# Patient Record
Sex: Female | Born: 1975
Health system: Southern US, Community
[De-identification: ages and names within clinical notes are randomized; demographics above are authoritative.]

## PROBLEM LIST (undated history)

## (undated) ENCOUNTER — Emergency Department (HOSPITAL_COMMUNITY): Admission: EM | Payer: Self-pay | Source: Home / Self Care

## (undated) DIAGNOSIS — Z800 Family history of malignant neoplasm of digestive organs: Secondary | ICD-10-CM

## (undated) DIAGNOSIS — K219 Gastro-esophageal reflux disease without esophagitis: Secondary | ICD-10-CM

## (undated) DIAGNOSIS — D649 Anemia, unspecified: Secondary | ICD-10-CM

## (undated) HISTORY — DX: Family history of malignant neoplasm of digestive organs: Z80.0

## (undated) HISTORY — DX: Gastro-esophageal reflux disease without esophagitis: K21.9

## (undated) HISTORY — DX: Anemia, unspecified: D64.9

---

## 1998-01-25 ENCOUNTER — Ambulatory Visit (HOSPITAL_COMMUNITY): Admission: RE | Admit: 1998-01-25 | Discharge: 1998-01-25 | Payer: Self-pay | Admitting: Gastroenterology

## 1999-04-27 ENCOUNTER — Inpatient Hospital Stay (HOSPITAL_COMMUNITY): Admission: AD | Admit: 1999-04-27 | Discharge: 1999-04-27 | Payer: Self-pay | Admitting: Obstetrics and Gynecology

## 1999-07-29 ENCOUNTER — Inpatient Hospital Stay (HOSPITAL_COMMUNITY): Admission: AD | Admit: 1999-07-29 | Discharge: 1999-07-29 | Payer: Self-pay | Admitting: Obstetrics and Gynecology

## 1999-07-30 ENCOUNTER — Inpatient Hospital Stay (HOSPITAL_COMMUNITY): Admission: AD | Admit: 1999-07-30 | Discharge: 1999-07-30 | Payer: Self-pay | Admitting: Obstetrics & Gynecology

## 1999-08-06 ENCOUNTER — Inpatient Hospital Stay (HOSPITAL_COMMUNITY): Admission: AD | Admit: 1999-08-06 | Discharge: 1999-08-08 | Payer: Self-pay | Admitting: Obstetrics and Gynecology

## 2001-01-31 ENCOUNTER — Ambulatory Visit (HOSPITAL_COMMUNITY): Admission: RE | Admit: 2001-01-31 | Discharge: 2001-01-31 | Payer: Self-pay | Admitting: Gastroenterology

## 2001-01-31 ENCOUNTER — Encounter (INDEPENDENT_AMBULATORY_CARE_PROVIDER_SITE_OTHER): Payer: Self-pay | Admitting: *Deleted

## 2001-08-12 ENCOUNTER — Other Ambulatory Visit: Admission: RE | Admit: 2001-08-12 | Discharge: 2001-08-12 | Payer: Self-pay | Admitting: Obstetrics and Gynecology

## 2002-01-31 ENCOUNTER — Inpatient Hospital Stay (HOSPITAL_COMMUNITY): Admission: AD | Admit: 2002-01-31 | Discharge: 2002-01-31 | Payer: Self-pay | Admitting: Obstetrics and Gynecology

## 2002-03-03 ENCOUNTER — Inpatient Hospital Stay (HOSPITAL_COMMUNITY): Admission: AD | Admit: 2002-03-03 | Discharge: 2002-03-05 | Payer: Self-pay | Admitting: Obstetrics and Gynecology

## 2002-09-01 ENCOUNTER — Other Ambulatory Visit: Admission: RE | Admit: 2002-09-01 | Discharge: 2002-09-01 | Payer: Self-pay | Admitting: Obstetrics and Gynecology

## 2004-02-15 ENCOUNTER — Other Ambulatory Visit: Admission: RE | Admit: 2004-02-15 | Discharge: 2004-02-15 | Payer: Self-pay | Admitting: Obstetrics and Gynecology

## 2004-02-29 ENCOUNTER — Inpatient Hospital Stay (HOSPITAL_COMMUNITY): Admission: AD | Admit: 2004-02-29 | Discharge: 2004-02-29 | Payer: Self-pay | Admitting: Obstetrics and Gynecology

## 2004-09-11 ENCOUNTER — Inpatient Hospital Stay (HOSPITAL_COMMUNITY): Admission: AD | Admit: 2004-09-11 | Discharge: 2004-09-13 | Payer: Self-pay | Admitting: Obstetrics and Gynecology

## 2005-02-17 ENCOUNTER — Other Ambulatory Visit: Admission: RE | Admit: 2005-02-17 | Discharge: 2005-02-17 | Payer: Self-pay | Admitting: Obstetrics and Gynecology

## 2005-09-27 ENCOUNTER — Encounter: Admission: RE | Admit: 2005-09-27 | Discharge: 2005-09-27 | Payer: Self-pay | Admitting: Family Medicine

## 2007-05-09 ENCOUNTER — Encounter: Admission: RE | Admit: 2007-05-09 | Discharge: 2007-05-09 | Payer: Self-pay | Admitting: Family Medicine

## 2008-02-14 ENCOUNTER — Encounter: Admission: RE | Admit: 2008-02-14 | Discharge: 2008-02-14 | Payer: Self-pay

## 2008-02-25 ENCOUNTER — Encounter: Admission: RE | Admit: 2008-02-25 | Discharge: 2008-02-25 | Payer: Self-pay | Admitting: Family Medicine

## 2009-02-25 ENCOUNTER — Encounter: Admission: RE | Admit: 2009-02-25 | Discharge: 2009-02-25 | Payer: Self-pay | Admitting: Endocrinology

## 2010-01-21 IMAGING — US US SOFT TISSUE HEAD/NECK
1 series · 14 of 25 positions shown · non-contrast
Comparison: [place] thyroid ultrasound 03/02/2008.

CLINICAL DATA: Follow-up goiter.  Family history thyroid disease.

THYROID ULTRASOUND
TECHNIQUE: Ultrasound examination of the thyroid gland and
adjacent soft tissues was performed.

[Series 1: us thyroid · 0.07mm/px · 14 of 28 slices shown]
[im 1/28]
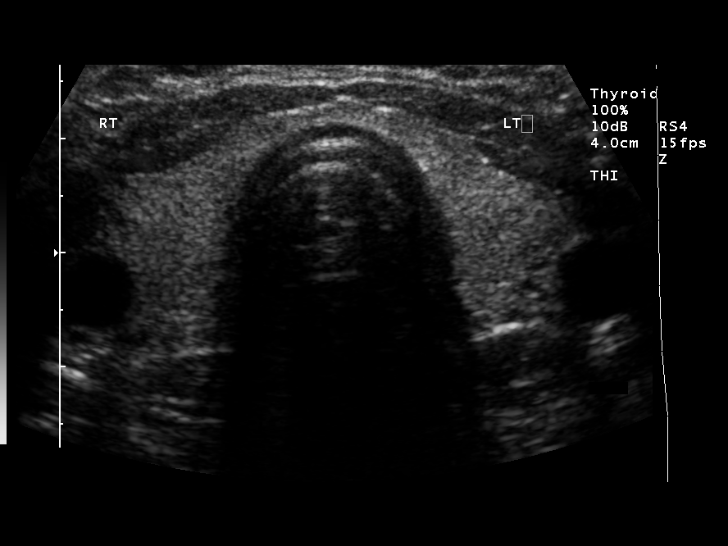
[im 3/28]
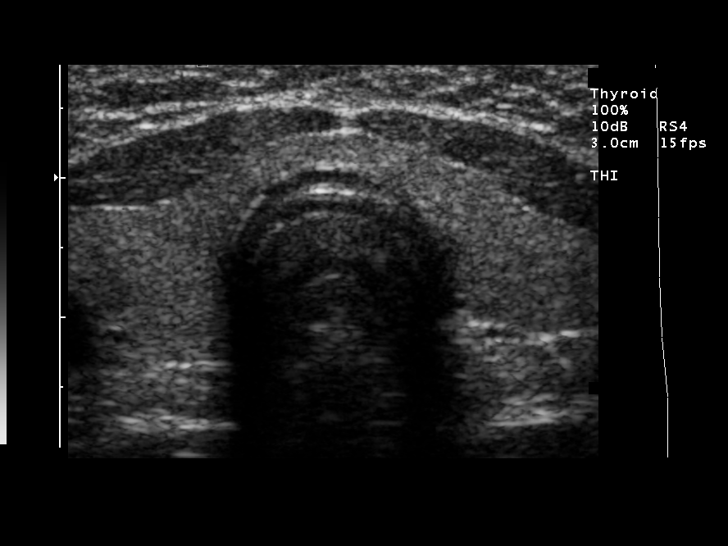
[im 5/28]
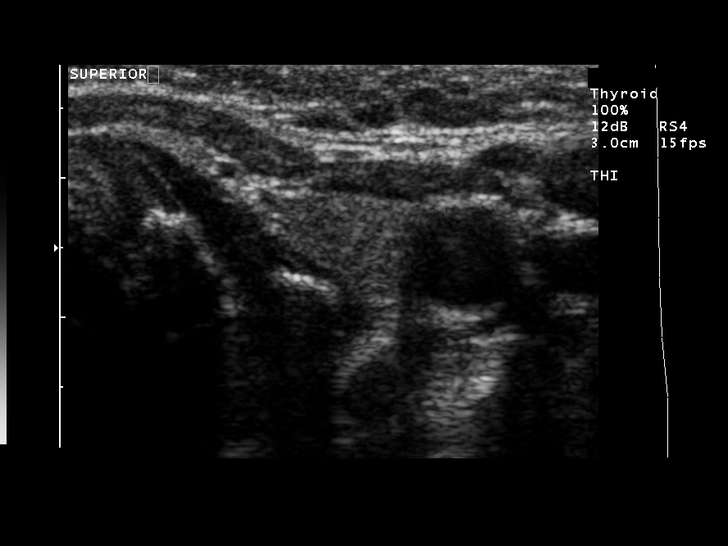
[im 7/28]
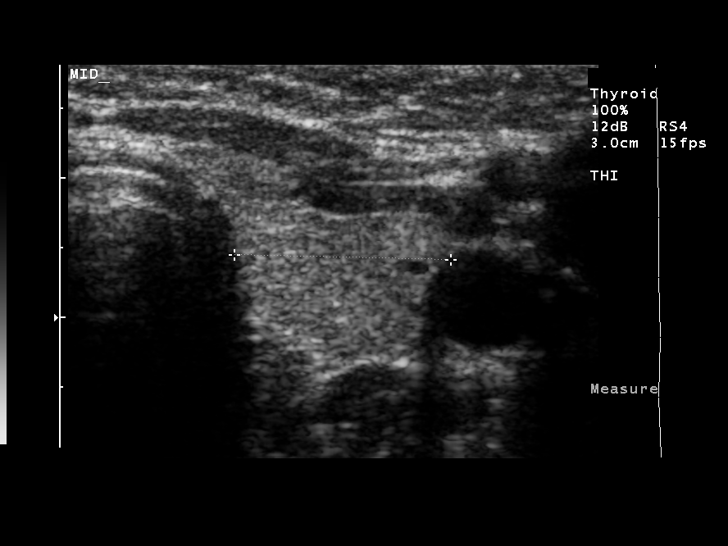
[im 10/28]
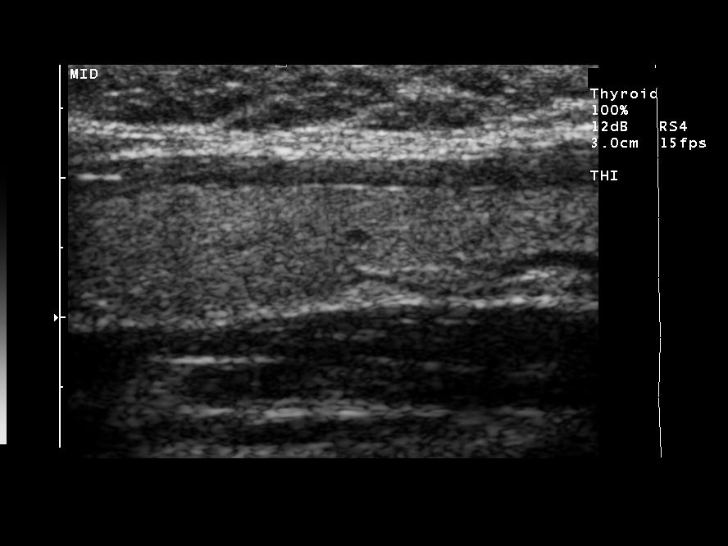
[im 11/28]
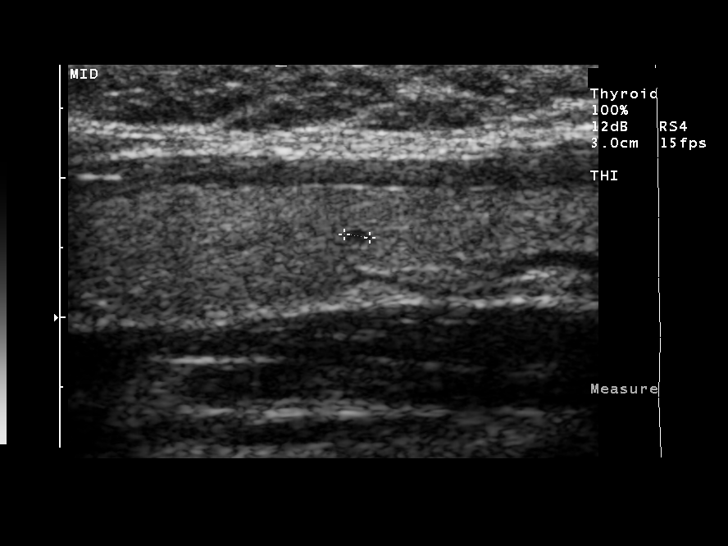
[im 13/28]
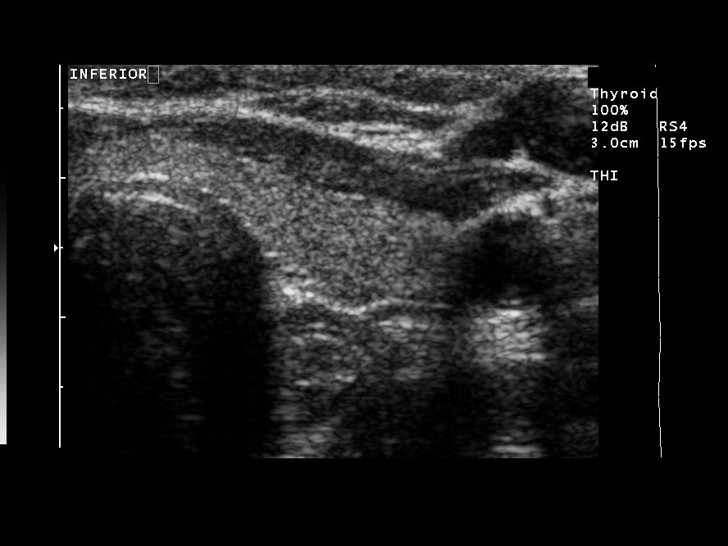
[im 15/28]
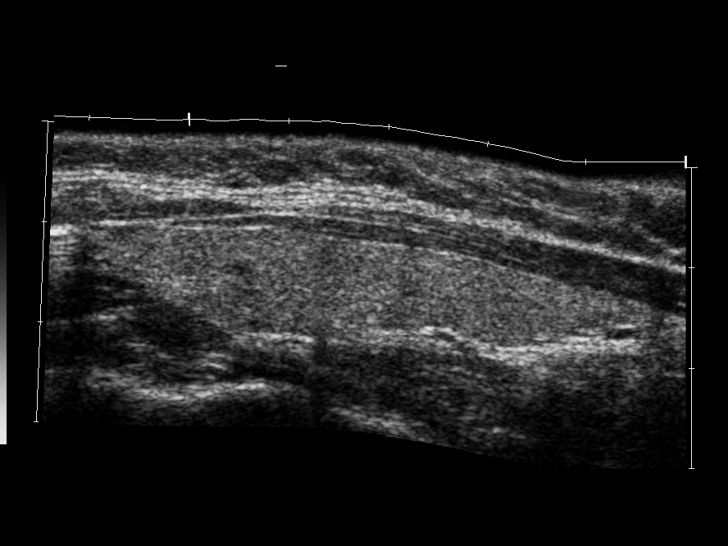
[im 17/28]
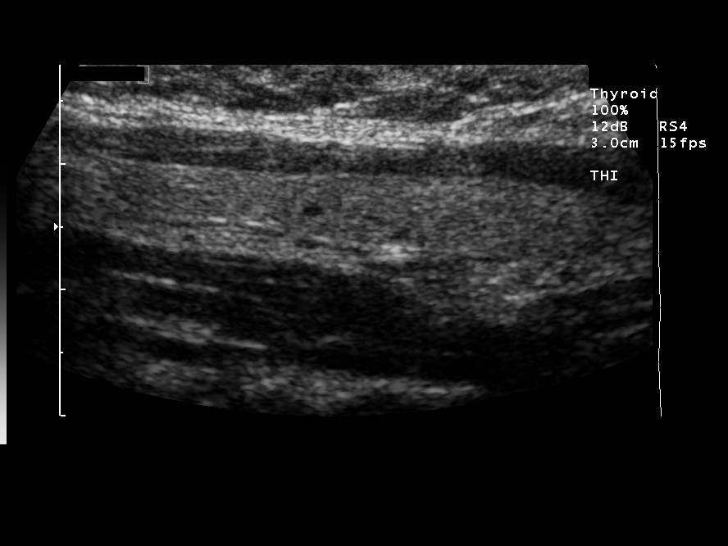
[im 19/28]
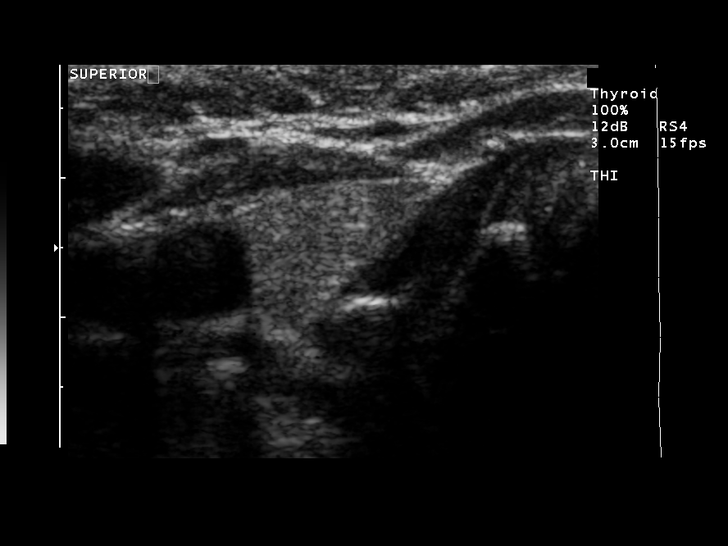
[im 21/28]
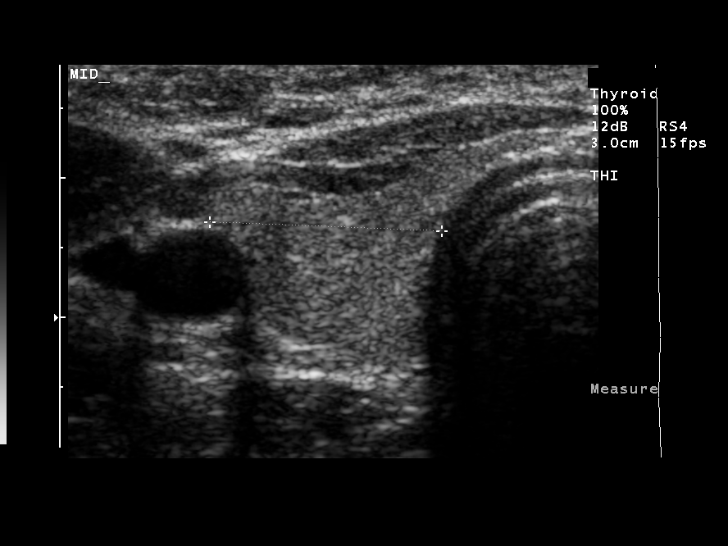
[im 23/28]
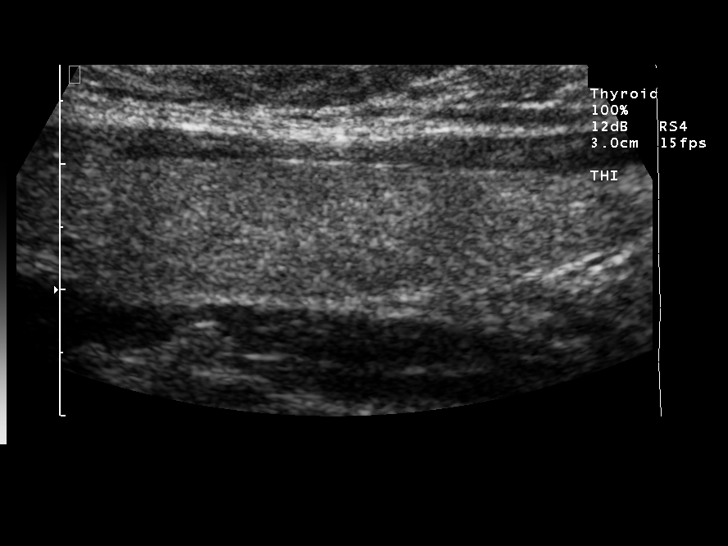
[im 25/28]
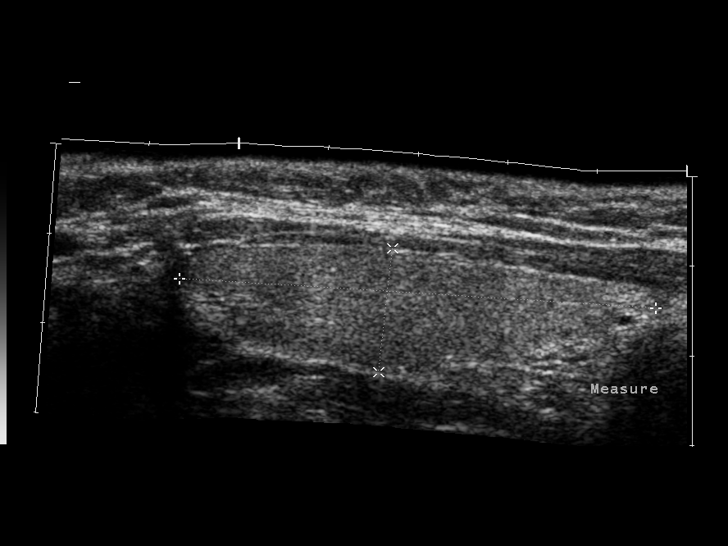
[im 28/28]
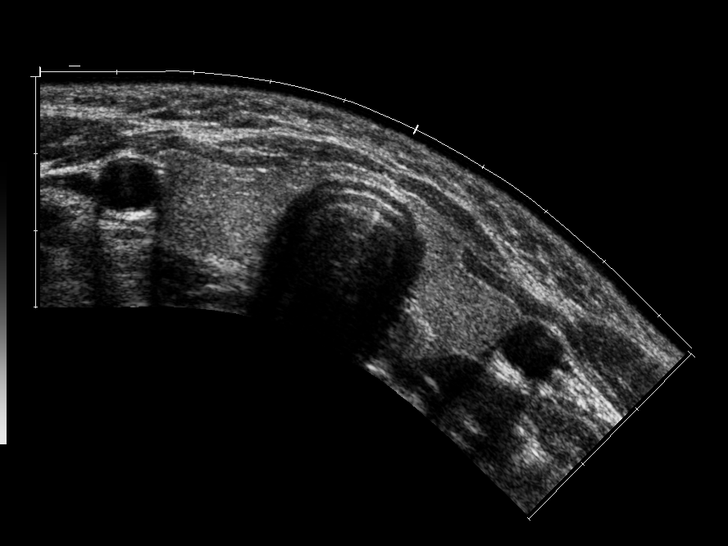

[14 of 25 positions shown; findings below may reference images not displayed]

FINDINGS: Stable thyroid size upper limits of normal with right
lobe measures 5.4 cm long X 1.5 cm AP X 1.8 cm wide (03/02/2008:
5.3 X 1.4 X 1.7 cm).  The left lobe currently measures 4.9 cm long
X 1.2 cm AP X 1.6 cm wide (03/02/2008: 5.6 X 1.1 X 1.6 cm).
Isthmus measures normally at 2 mm AP thickness.  Thyroid
echotexture remains homogeneous.  At the mid left lobe thyroid is
simple 3 mm cyst with increased through transmission.  No focal
solid lesions seen.
IMPRESSION: 1.  3 mm mid left thyroid benign appearing cyst.
2.  Otherwise, negative.

## 2010-06-24 NOTE — H&P (Signed)
Sarah Shaw, [place] NO.:  [id number]   MEDICAL RECORD NO.:  [id number]          PATIENT TYPE:  MAT   LOCATION:  MAT[facility]                          FACILITY:  [facility]   PHYSICIAN:  Hal Morales, M.D.DATE OF BIRTH:  29-Apr-1975   DATE OF ADMISSION:  09/11/2004  DATE OF DISCHARGE:                                HISTORY & PHYSICAL   Sarah Shaw is a 35 year old married black female, gravida 3, para 2-0-0-2,  at 40 weeks who presents with contractions this evening, every five minutes.  She denies leaking and bleeding.  No signs or symptoms of PIH.  Her  pregnancy has been followed by [facility] certified nurse  midwife service and has been remarkable for hiatal hernia and group B strep  negative.   PRENATAL LABS:  Her prenatal labs were collected on February 15, 2004.  Hemoglobin 13.1, hematocrit 38.5, platelets 285,000, blood type B positive,  antibody negative.  Sickle cell trait negative.  RPR nonreactive.  Rubella  immune.  Hepatitis B surface antigen negative.  Quad screen from April 13, 2004 is within normal limits.  One-hour Glucola from Jun 09, 2004 is 99.  Culture of the vaginal tract for group B strep on August 15, 2004 was  negative.   HISTORY OF PRESENT PREGNANCY:  The patient presented for care at [facility] on February 15, 2004 at [redacted] weeks gestation.  She had IV hydration at  [facility] at [redacted] weeks gestation. She had strep throat at 14 weeks.  Ultrasonography at [redacted] weeks gestation shows growth consistent with previous  dating confirming [facility] of September 11, 2004.  The rest of her prenatal care has  been unremarkable.   OB HISTORY:  She is a gravida 3, para 2-0-0-2.  Initial June 2001 she had a  vaginal delivery of a female infant weighing 6 pounds 5 ounces at 39-6/[redacted] weeks  gestation after 15 hours in labor.  Infant's name was Sarah Shaw.  In January  2004 she had a vaginal delivery of a female infant weighing 7 pounds and 6  ounces at 39-2/[redacted] weeks  gestation after 14 hours in the labor.  Infant's name  was Sarah Shaw.  The pregnancies were uncomplicated.   PAST MEDICAL HISTORY:  1.  No medication allergies.  2.  She experienced menarche at age of 80 with 28-30 days cycles lasting      three to four days.  3.  She has used condoms and foam in the past for contraception.  4.  She reports having had the usual childhood illnesses.  5.  She has history of hiatal hernia.  6.  She had an endoscopy performed in 1992.   FAMILY HISTORY:  Remarkable for mother with hypertension.  Genetic history  is negative.   SOCIAL HISTORY:  The patient is married to the father of the baby.  His name  is Tajma.  He is involved in support as they are the [employer].  The  patient is employed as an Scientist, forensic at [employer].  The patient father of the  baby is a dialysis  tech.  They deny any alcohol, tobacco or illicit drug use  with this pregnancy.   PHYSICAL EXAMINATION:  VITAL SIGNS:  Stable.  Afebrile.  HEENT:  Within normal limits.  CHEST:  Clear to auscultation.  HEART:  Regular rate and rhythm.  ABDOMEN:  Gravid contour with fundal height extending approximately 40 cm  above the pubic symphysis.  Fetal heart rate is remarkable for frequent  variables and early variables with decreased long-term variability.  Contractions every 6 minutes.  PELVIC:  Cervix is 2 cm, 60%, posterior, vertex -2 with intact membranes and  remain unchanged after 45 minutes.  EXTREMITIES:  Normal.   ASSESSMENT:  1.  Intrauterine pregnancy at term.  2.  Nonreactive fetal heart rate.   PLAN:  Admit the patient to nursing suite.  Options were reviewed with the  patient of:  1.  Observe fetal heart rate during the night, versus  2.  Induce labor tonight due to fetal heart rate tracing.   The patient elects induction of labor tonight.  The plan is to artificially  rupture her membranes and Dr. Pennie Rushing is aware.       KS/MEDQ  D:  09/11/2004  T:  09/11/2004  Job:   [id number]

## 2010-06-24 NOTE — H&P (Signed)
[facility]  Patient:    Sarah Shaw, Sarah Shaw                      MRN: [id number] Adm. Date:  [id number] Attending:  Leonard Schwartz Dictator:   Miguel Dibble, C.N.M.                         History and Physical  DATE OF BIRTH:                07-Jul-1975  HISTORY OF PRESENT ILLNESS:   This is a 35 year old gravida 1, para 0 at 39-6/7 weeks who was evaluated on the maternity admissions unit and found to progress from 4 cm, 90% effaced to 5 cm, 90% effaced and -1 after one hour of evaluation.  She has a positive group beta strep history.  PRENATAL LABORATORY DATA:      Hemoglobin 12.6, hematocrit unavailable and platelets 256,000.  Blood type and Rh:  B positive, Rh antibodies negative. VDRL nonreactive.  Rubella titer positive.  Hepatitis B surface antigen negative.  HIV negative.  Pap smear within normal limits.  PPD negative in February of 2001.  Maternal serum alpha-fetoprotein within normal limits. Glucola challenge test at 28 was within normal limits.  Group beta strep at 36 weeks was positive.  Ultrasound reveals normal anatomy, confirms [facility] of August 07, 1999.  Patient transferred care from [facility] at 21 weeks with records.  MEDICAL HISTORY:              Denies drug allergies.  History of hiatal hernia, gastroesophageal reflux disease, ______.  Used Prilosec and Propulsid.  Patient had an endoscopy in 1992 for GI problems.  FAMILY HISTORY:               Mother with hypertension.  GENETIC HISTORY:              Negative.  SOCIAL HISTORY:               Married to [place].  African-American. Baptist religion.  College graduate.  Works as an Astronomer. full-time.  Father of the baby is a Engineer, maintenance (IT) and works as a Buyer, retail for dialysis.  Stable monogamous relationship.  Denies smoking, alcohol or drug abuse.  PHYSICAL EXAMINATION:  HEENT:                        Head, eyes, ears, nose, and throat within  normal limits.  LUNGS:                        Bilaterally clear.  HEART:                        Regular rate and rhythm.  ABDOMEN:                      Soft and nontender.  Contractions every 4-5 minutes lasting 60 seconds, moderate.  PELVIC EXAMINATION:           Fetal heart rate:  good long term variability, mild variable decelerations with each contraction.  Cervix is 5 cm, 90% effaced, vertex and -1.  Intact bag of waters.  OP position.  EXTREMITIES:                  Trace edema.  NEUROLOGIC:  DTRs +1.  ASSESSMENT:                   1. Primigravida at term in early active labor.                               2. Occiput posterior positive.                               3. Positive group beta streptococcus.                               4. History of gastroesophageal reflux disease                                  and hiatal hernia.  PLAN:                         1. Admit to labor and delivery.                               2. Notify Dr. Stefano Gaul of admission and status.                               3. Routine [facility] orders                                  including group beta strep prophylaxis.                               4. IV bolus and hydration to stabilize fetal                                  heart rate.                               5. Antacids p.r.n.                               6. Anticipate normal spontaneous vaginal                                  delivery. DD:  08/06/99 TD:  08/06/99 Job: [id number]

## 2010-06-24 NOTE — H&P (Signed)
NAME:  DESHONNA, TRNKA                         ACCOUNT NO.:  [id number]   MEDICAL RECORD NO.:  [id number]                   PATIENT TYPE:  INP   LOCATION:  9162                                 FACILITY:  [facility]   PHYSICIAN:  Osborn Coho, M.D.                DATE OF BIRTH:  [birth year]   DATE OF ADMISSION:  03/03/2002  DATE OF DISCHARGE:                                HISTORY & PHYSICAL   HISTORY OF PRESENT ILLNESS:  The patient is a 35 year old gravida 2, para 1-  0-0-1 at 39-2/7 weeks, EDD March 08, 2002, by dates, confirmed with early  pregnancy ultrasonography at 7 weeks 0 days and confirmed with followup  ultrasound at 18 weeks for anatomy.  She presented at approximately 1:30 in  the morning with contractions increasing in frequency and intensity,  positive fetal movement, no bleeding, no rupture of membranes.  She has had  no had any complaint of headache, visual changes or epigastric pain.  She  was evaluated at that time and found to be 2 cm dilated, 70% effaced with  the cephalic presenting part at a -2 station.  Her baby was reactive, and  she ambulated for one hour.  Re-evaluation of her labor progress at 3:15  found her cervix to be unchanged.  The plan at that time was for the patient  to be discharged home in early labor.  In light of the inclement weather  outside, the patient remained resting in room and did not actually leave the  hospital.  At approximately 6:30 a.m. she began throwing up and with a large  bloody show.  The R.N. in MAU checked her, and she was found to be 4-5 cm  dilated, 70% effaced with the cephalic presenting part at a -2 station.  She  is therefore admitted in early labor.  Her pregnancy has been followed by  the CNM service and is remarkable for:  1) First trimester bleeding.  2)  Hiatal hernia.  3) Group B strep negative.   PAST OBSTETRICAL HISTORY:  In 2001, she had a normal spontaneous vaginal  delivery with the birth of a 6 pound 5 ounce  female infant at term.   PAST MEDICAL HISTORY:  Hiatal hernia.  She had an endoscopy in 1992 and  takes Prevacid.   FAMILY HISTORY:  The patient's mother has hypertension.  Otherwise  unremarkable.   GENETIC HISTORY:  There is no family history of genetic or chromosomal  disorders, children that died in infancy or that were born with birth  defects.   ALLERGIES:  The patient has no known drug allergies and denies the use of  tobacco, alcohol or illicit drugs.   SOCIAL HISTORY:  The patient is a married African American female.  Her  husband is involved and supportive.  They are [facility] in their faith.   REVIEW OF SYSTEMS:  As described above.  The patient  is typical of one with  a uterine pregnancy at term in early labor.   PHYSICAL EXAMINATION:  VITAL SIGNS:  Stable vital signs.  Afebrile.  HEENT:  Unremarkable.  HEART:  Regular rate and rhythm.  LUNGS:  Clear.  ABDOMEN:  Gravid in its contour.  Uterine fundus is noted to extend 39 cm  above the level of the pubic  symphysis.  Leopold's maneuver finds the  infant to be in a longitudinal lie, cephalic presentation, and the estimated  fetal weight is 7-1/2 pounds.  The fetal heart rate baseline is 140s with  positive variability, positive accelerations.  Fetal heart rate is reactive  with no periodic changes.  She is contracting every three to four minutes.  Her cervix is noted to be 4-5 cm, 70% effaced with the cephalic presenting  part at a -2 station and membranes intact.  EXTREMITIES:  No pathologic edema.  DTRs are 1+ with no clonus.   ASSESSMENT:  1. Intrauterine pregnancy at term.  2. Early labor.   PLAN:  1. Admit per Dr. Osborn Coho.  2. Routine M.D. orders.  3. Anticipate spontaneous vaginal delivery.     Rica Koyanagi, C.N.M.               Osborn Coho, M.D.    SDM/MEDQ  D:  03/03/2002  T:  03/03/2002  Job:  [id number]

## 2011-05-19 ENCOUNTER — Ambulatory Visit (INDEPENDENT_AMBULATORY_CARE_PROVIDER_SITE_OTHER): Payer: PRIVATE HEALTH INSURANCE | Admitting: Obstetrics and Gynecology

## 2011-05-19 ENCOUNTER — Encounter: Payer: Self-pay | Admitting: Obstetrics and Gynecology

## 2011-05-19 VITALS — BP 104/72 | HR 74 | Temp 99.0°F | Resp 16 | Ht 66.0 in | Wt 217.0 lb

## 2011-05-19 DIAGNOSIS — Z124 Encounter for screening for malignant neoplasm of cervix: Secondary | ICD-10-CM

## 2011-05-19 DIAGNOSIS — Z0000 Encounter for general adult medical examination without abnormal findings: Secondary | ICD-10-CM | POA: Insufficient documentation

## 2011-05-19 DIAGNOSIS — Z01419 Encounter for gynecological examination (general) (routine) without abnormal findings: Secondary | ICD-10-CM

## 2011-05-19 NOTE — Progress Notes (Signed)
Subjective:    Sarah Shaw is a 36 y.o. female, G3P3, who presents for an annual exam. The patient reports:normal menses, no abnormal bleeding, pelvic pain or discharge, no complaints    History  Sexual Activity  . Sexually Active: Not on file  .  History  Smoking status  . Never Smoker   Smokeless tobacco  . Not on file  .  Last mammogram: patient has never had a mammogram and not applicable  Last pap: approximate date 4/12 and was normal and not applicable    GC/Chlamydia cultures offered: declined HIV/RPR/HbsAg offered: declined HSV 1 and 2 glycoprotein offered: declined  Menstrual cycle:   LMP: Patient's last menstrual period was 05/05/2011.           Cycle is monthly with normal flow and without intermenstrual bleeding or severe dysmenorrhea   The following portions of the patient's history were reviewed and updated as appropriate: allergies, current medications, past family history, past medical history, past social history, past surgical history and problem list.  Review of Systems A comprehensive review of systems was negative. Breast:Negative for breast lump,nipple discharge or nipple retraction Gastrointestinal: Negative for abdominal pain, change in bowel habits or rectal bleeding Urinary:negative for dysuria, frequency, incontinence    Objective:    BP 104/72  Pulse 74  Temp 99 F (37.2 C)  Resp 16  Ht 5\' 6"  (1.676 m)  Wt 217 lb (98.431 kg)  BMI 35.02 kg/m2  LMP 05/05/2011 Weight:  Wt Readings from Last 1 Encounters:  05/19/11 217 lb (98.431 kg)   BMI: Body mass index is 35.02 kg/(m^2). General Appearance: Alert, appropriate appearance for age. No acute distress HEENT: Grossly normal Neck / Thyroid: Supple, no masses, nodes or enlargement Lungs: clear to auscultation bilaterally Back: No CVA tenderness Breast Exam: No dimpling, nipple retraction or discharge. No masses or nodes. and No masses or nodes.No dimpling, nipple retraction or  discharge. Cardiovascular: Regular rate and rhythm. S1, S2, no murmur Gastrointestinal: Soft, non-tender, no masses or organomegaly Pelvic Exam: Vulva and vagina appear normal. Bimanual exam reveals normal uterus and adnexa. Rectovaginal: not indicated and normal rectal, no masses Lymphatic Exam: Non-palpable nodes in neck, clavicular, axillary, or inguinal regions Skin: no rash or abnormalities Neurologic: Normal gait and speech, no tremor  Psychiatric: Alert and oriented, appropriate affect.   Wet Prep:not applicable Urinalysis:not applicable UPT: Not done   Assessment:    Normal gyn exam    Plan:    pap smear return annually or prn STD screening: declined Contraception:vasectomy      Yosiel Thieme, CNM

## 2011-05-19 NOTE — Progress Notes (Signed)
Regular Periods: yes Mammogram:yes/ 2008  Monthly Breast Ex: yes Exercise: yes/ 2x's a week  Tetanus < 10 yrs: yes Seatbelts: yes  NI. Bladder Functn.:yes Abuse at Home: no  Daily BM's: yes Stressful Work: no  Healthy Diet: yes Sigmoid-colonoscopy: no  Calcium: no Medical problems this year:no   Last Pap: 2012   Contraception:  Husband Vasectomy   PCP: Dr. Renae Gloss   Changes in PMH: no  Changes in [facility]: no   Abdominal Pain/Pelvic Pain: no  Vaginal Discharge: no  Irregular Bleeding: No    Menopausal Symptoms:no

## 2011-05-23 LAB — PAP IG W/ RFLX HPV ASCU

## 2013-12-08 ENCOUNTER — Encounter: Payer: Self-pay | Admitting: Obstetrics and Gynecology

## 2014-10-15 ENCOUNTER — Other Ambulatory Visit: Payer: Self-pay

## 2014-10-15 DIAGNOSIS — Z1231 Encounter for screening mammogram for malignant neoplasm of breast: Secondary | ICD-10-CM

## 2014-11-16 ENCOUNTER — Ambulatory Visit
Admission: RE | Admit: 2014-11-16 | Discharge: 2014-11-16 | Disposition: A | Discharge status: Home / Self Care | Payer: 59 | Source: Ambulatory Visit | Attending: Family | Admitting: Family

## 2014-11-16 DIAGNOSIS — Z1231 Encounter for screening mammogram for malignant neoplasm of breast: Secondary | ICD-10-CM

## 2015-08-09 DIAGNOSIS — E040 Nontoxic diffuse goiter: Secondary | ICD-10-CM | POA: Diagnosis not present

## 2015-08-09 DIAGNOSIS — R5383 Other fatigue: Secondary | ICD-10-CM | POA: Diagnosis not present

## 2015-08-09 DIAGNOSIS — H6122 Impacted cerumen, left ear: Secondary | ICD-10-CM | POA: Diagnosis not present

## 2015-08-09 DIAGNOSIS — E559 Vitamin D deficiency, unspecified: Secondary | ICD-10-CM | POA: Diagnosis not present

## 2015-08-09 DIAGNOSIS — R21 Rash and other nonspecific skin eruption: Secondary | ICD-10-CM | POA: Diagnosis not present

## 2015-08-09 DIAGNOSIS — Z0000 Encounter for general adult medical examination without abnormal findings: Secondary | ICD-10-CM | POA: Diagnosis not present

## 2015-08-12 ENCOUNTER — Other Ambulatory Visit: Payer: Self-pay | Admitting: Internal Medicine

## 2015-08-12 ENCOUNTER — Other Ambulatory Visit: Payer: Self-pay | Admitting: Nurse Practitioner

## 2015-08-12 DIAGNOSIS — E040 Nontoxic diffuse goiter: Secondary | ICD-10-CM

## 2015-08-16 ENCOUNTER — Ambulatory Visit
Admission: RE | Admit: 2015-08-16 | Discharge: 2015-08-16 | Disposition: A | Discharge status: Home / Self Care | Payer: 59 | Source: Ambulatory Visit | Attending: Internal Medicine | Admitting: Internal Medicine

## 2015-08-16 DIAGNOSIS — E041 Nontoxic single thyroid nodule: Secondary | ICD-10-CM | POA: Diagnosis not present

## 2015-08-16 DIAGNOSIS — E040 Nontoxic diffuse goiter: Secondary | ICD-10-CM

## 2015-10-12 ENCOUNTER — Other Ambulatory Visit: Payer: Self-pay | Admitting: Internal Medicine

## 2015-10-12 DIAGNOSIS — Z1231 Encounter for screening mammogram for malignant neoplasm of breast: Secondary | ICD-10-CM

## 2015-11-18 ENCOUNTER — Ambulatory Visit: Payer: 59

## 2015-11-18 ENCOUNTER — Ambulatory Visit
Admission: RE | Admit: 2015-11-18 | Discharge: 2015-11-18 | Disposition: A | Discharge status: Home / Self Care | Payer: 59 | Source: Ambulatory Visit | Attending: Internal Medicine | Admitting: Internal Medicine

## 2015-11-18 DIAGNOSIS — Z1231 Encounter for screening mammogram for malignant neoplasm of breast: Secondary | ICD-10-CM | POA: Diagnosis not present

## 2015-12-01 DIAGNOSIS — Z01419 Encounter for gynecological examination (general) (routine) without abnormal findings: Secondary | ICD-10-CM | POA: Diagnosis not present

## 2016-08-21 DIAGNOSIS — E040 Nontoxic diffuse goiter: Secondary | ICD-10-CM | POA: Diagnosis not present

## 2016-08-21 DIAGNOSIS — Z6836 Body mass index (BMI) 36.0-36.9, adult: Secondary | ICD-10-CM | POA: Diagnosis not present

## 2016-08-21 DIAGNOSIS — E669 Obesity, unspecified: Secondary | ICD-10-CM | POA: Diagnosis not present

## 2016-08-21 DIAGNOSIS — Z0000 Encounter for general adult medical examination without abnormal findings: Secondary | ICD-10-CM | POA: Diagnosis not present

## 2016-08-21 DIAGNOSIS — Z1239 Encounter for other screening for malignant neoplasm of breast: Secondary | ICD-10-CM | POA: Diagnosis not present

## 2016-08-21 DIAGNOSIS — E559 Vitamin D deficiency, unspecified: Secondary | ICD-10-CM | POA: Diagnosis not present

## 2016-08-23 ENCOUNTER — Other Ambulatory Visit: Payer: Self-pay | Admitting: Nurse Practitioner

## 2016-08-23 DIAGNOSIS — E040 Nontoxic diffuse goiter: Secondary | ICD-10-CM

## 2016-08-30 ENCOUNTER — Ambulatory Visit
Admission: RE | Admit: 2016-08-30 | Discharge: 2016-08-30 | Disposition: A | Discharge status: Home / Self Care | Payer: 59 | Source: Ambulatory Visit | Attending: Nurse Practitioner | Admitting: Nurse Practitioner

## 2016-08-30 DIAGNOSIS — E040 Nontoxic diffuse goiter: Secondary | ICD-10-CM

## 2016-08-30 DIAGNOSIS — E041 Nontoxic single thyroid nodule: Secondary | ICD-10-CM | POA: Diagnosis not present

## 2016-11-17 ENCOUNTER — Other Ambulatory Visit: Payer: Self-pay | Admitting: Internal Medicine

## 2016-11-17 DIAGNOSIS — Z1231 Encounter for screening mammogram for malignant neoplasm of breast: Secondary | ICD-10-CM

## 2016-12-08 ENCOUNTER — Ambulatory Visit
Admission: RE | Admit: 2016-12-08 | Discharge: 2016-12-08 | Disposition: A | Discharge status: Home / Self Care | Payer: 59 | Source: Ambulatory Visit | Attending: Internal Medicine | Admitting: Internal Medicine

## 2016-12-08 DIAGNOSIS — Z1231 Encounter for screening mammogram for malignant neoplasm of breast: Secondary | ICD-10-CM

## 2017-02-26 DIAGNOSIS — R7309 Other abnormal glucose: Secondary | ICD-10-CM | POA: Diagnosis not present

## 2017-02-26 DIAGNOSIS — E559 Vitamin D deficiency, unspecified: Secondary | ICD-10-CM | POA: Diagnosis not present

## 2017-10-11 DIAGNOSIS — E041 Nontoxic single thyroid nodule: Secondary | ICD-10-CM | POA: Diagnosis not present

## 2017-10-11 DIAGNOSIS — R7309 Other abnormal glucose: Secondary | ICD-10-CM | POA: Diagnosis not present

## 2017-10-11 DIAGNOSIS — R21 Rash and other nonspecific skin eruption: Secondary | ICD-10-CM | POA: Diagnosis not present

## 2017-10-11 DIAGNOSIS — E040 Nontoxic diffuse goiter: Secondary | ICD-10-CM | POA: Diagnosis not present

## 2017-10-11 DIAGNOSIS — E559 Vitamin D deficiency, unspecified: Secondary | ICD-10-CM | POA: Diagnosis not present

## 2017-10-11 DIAGNOSIS — Z0000 Encounter for general adult medical examination without abnormal findings: Secondary | ICD-10-CM | POA: Diagnosis not present

## 2017-10-11 DIAGNOSIS — Z1239 Encounter for other screening for malignant neoplasm of breast: Secondary | ICD-10-CM | POA: Diagnosis not present

## 2017-10-26 ENCOUNTER — Other Ambulatory Visit: Payer: Self-pay | Admitting: Nurse Practitioner

## 2017-10-26 DIAGNOSIS — E040 Nontoxic diffuse goiter: Secondary | ICD-10-CM

## 2017-11-13 ENCOUNTER — Ambulatory Visit
Admission: RE | Admit: 2017-11-13 | Discharge: 2017-11-13 | Disposition: A | Discharge status: Home / Self Care | Payer: 59 | Source: Ambulatory Visit | Attending: Nurse Practitioner | Admitting: Nurse Practitioner

## 2017-11-13 DIAGNOSIS — E042 Nontoxic multinodular goiter: Secondary | ICD-10-CM | POA: Diagnosis not present

## 2017-11-13 DIAGNOSIS — E040 Nontoxic diffuse goiter: Secondary | ICD-10-CM

## 2017-12-17 DIAGNOSIS — Z6833 Body mass index (BMI) 33.0-33.9, adult: Secondary | ICD-10-CM | POA: Diagnosis not present

## 2017-12-17 DIAGNOSIS — Z01419 Encounter for gynecological examination (general) (routine) without abnormal findings: Secondary | ICD-10-CM | POA: Diagnosis not present

## 2018-01-28 ENCOUNTER — Other Ambulatory Visit: Payer: Self-pay | Admitting: Internal Medicine

## 2018-01-28 DIAGNOSIS — Z1231 Encounter for screening mammogram for malignant neoplasm of breast: Secondary | ICD-10-CM

## 2018-02-13 ENCOUNTER — Ambulatory Visit: Payer: 59 | Admitting: Internal Medicine

## 2018-03-05 ENCOUNTER — Ambulatory Visit
Admission: RE | Admit: 2018-03-05 | Discharge: 2018-03-05 | Disposition: A | Discharge status: Home / Self Care | Payer: 59 | Source: Ambulatory Visit | Attending: Internal Medicine | Admitting: Internal Medicine

## 2018-03-05 DIAGNOSIS — Z1231 Encounter for screening mammogram for malignant neoplasm of breast: Secondary | ICD-10-CM | POA: Diagnosis not present

## 2018-04-11 ENCOUNTER — Encounter: Payer: Self-pay | Admitting: Nurse Practitioner

## 2018-04-11 ENCOUNTER — Ambulatory Visit: Payer: 59 | Admitting: Nurse Practitioner

## 2018-04-11 VITALS — BP 122/80 | HR 68 | Ht 66.0 in | Wt 222.0 lb

## 2018-04-11 DIAGNOSIS — E559 Vitamin D deficiency, unspecified: Secondary | ICD-10-CM | POA: Diagnosis not present

## 2018-04-11 DIAGNOSIS — R7303 Prediabetes: Secondary | ICD-10-CM

## 2018-04-11 NOTE — Progress Notes (Signed)
Subjective:     Patient ID: Sarah Shaw , female    DOB: 1975-03-26 , 43 y.o.   MRN: [id number]   Chief Complaint  Patient presents with  . follow up for pre dm check    follow up  from dm , no other concerns     HPI  Diabetes  She presents for her follow-up diabetic visit. Diabetes type: prediabetes. Her disease course has been stable. Pertinent negatives for hypoglycemia include no dizziness or headaches. Pertinent negatives for diabetes include no blurred vision, no chest pain, no fatigue, no polydipsia, no polyphagia and no polyuria. There are no hypoglycemic complications. Symptoms are improving. There are no diabetic complications. Risk factors for coronary artery disease include diabetes mellitus and obesity. She is compliant with treatment all of the time. She is following a generally healthy diet. When asked about meal planning, she reported none. She has not had a previous visit with a dietitian. She participates in exercise three times a week. An ACE inhibitor/angiotensin II receptor blocker is not being taken. She does not see a podiatrist.Eye exam is not current.     No past medical history on file.   No family history on file.   Current Outpatient Medications:  .  Ergocalciferol (VITAMIN D2) 10 MCG (400 UNIT) TABS, Vitamin D2  1,000 mg po qd, Disp: , Rfl:    No Known Allergies   Review of Systems  Constitutional: Negative for fatigue.  Eyes: Negative for blurred vision, photophobia and visual disturbance.  Respiratory: Negative for cough.   Cardiovascular: Negative for chest pain, palpitations and leg swelling.  Gastrointestinal: Negative for nausea.  Endocrine: Negative for polydipsia, polyphagia and polyuria.  Musculoskeletal: Negative.   Neurological: Negative for dizziness and headaches.     Today's Vitals   04/11/18 0854  BP: 122/80  Pulse: 68  SpO2: 98%  Weight: 222 lb (100.7 kg)  Height: 5\' 6"  (1.676 m)   Body mass index is 35.83 kg/m.    Objective:  Physical Exam Vitals signs reviewed.  Constitutional:      Appearance: Normal appearance.  Cardiovascular:     Rate and Rhythm: Normal rate and regular rhythm.     Pulses: Normal pulses.     Heart sounds: Normal heart sounds. No murmur.  Pulmonary:     Effort: Pulmonary effort is normal. No respiratory distress.     Breath sounds: Normal breath sounds.  Musculoskeletal: Normal range of motion.        General: No swelling or tenderness.  Skin:    General: Skin is warm and dry.     Capillary Refill: Capillary refill takes less than 2 seconds.  Neurological:     General: No focal deficit present.     Mental Status: She is alert and oriented to person, place, and time.  Psychiatric:        Mood and Affect: Mood normal.        Behavior: Behavior normal.        Thought Content: Thought content normal.        Judgment: Judgment normal.         Assessment And Plan:     1. Prediabetes  Chronic, improving was 5.7 at last visit  No current medications, diet and exercise controlled.   Encouraged to limit intake of sugary foods and drinks  Encouraged to increase physical activity to 150 minutes per week - Hemoglobin A1c  2. Vitamin D deficiency  Will check vitamin D level and supplement  as needed.     Also encouraged to spend 15 minutes in the sun daily.  - Vitamin D (25 hydroxy)     Minette Brine, FNP

## 2018-04-12 ENCOUNTER — Other Ambulatory Visit: Payer: Self-pay | Admitting: Nurse Practitioner

## 2018-04-12 ENCOUNTER — Encounter: Payer: Self-pay | Admitting: Nurse Practitioner

## 2018-04-12 DIAGNOSIS — R7303 Prediabetes: Secondary | ICD-10-CM | POA: Insufficient documentation

## 2018-04-12 DIAGNOSIS — E559 Vitamin D deficiency, unspecified: Secondary | ICD-10-CM | POA: Insufficient documentation

## 2018-04-12 LAB — HEMOGLOBIN A1C
Est. average glucose Bld gHb Est-mCnc: 120 mg/dL
Hgb A1c MFr Bld: 5.8 % — ABNORMAL HIGH (ref 4.8–5.6)

## 2018-04-12 LAB — VITAMIN D 25 HYDROXY (VIT D DEFICIENCY, FRACTURES): Vit D, 25-Hydroxy: 8.5 ng/mL — ABNORMAL LOW (ref 30.0–100.0)

## 2018-04-12 MED ORDER — VITAMIN D (ERGOCALCIFEROL) 1.25 MG (50000 UNIT) PO CAPS: 50000.0000 [IU] | ORAL_CAPSULE | ORAL | 1 refills | Status: DC

## 2018-10-16 ENCOUNTER — Other Ambulatory Visit: Payer: Self-pay

## 2018-10-16 ENCOUNTER — Encounter: Payer: Self-pay | Admitting: Nurse Practitioner

## 2018-10-16 ENCOUNTER — Ambulatory Visit: Payer: 59 | Admitting: Nurse Practitioner

## 2018-10-16 VITALS — BP 128/84 | HR 96 | Temp 97.7°F | Ht 66.4 in | Wt 222.0 lb

## 2018-10-16 DIAGNOSIS — E559 Vitamin D deficiency, unspecified: Secondary | ICD-10-CM

## 2018-10-16 DIAGNOSIS — R7303 Prediabetes: Secondary | ICD-10-CM | POA: Diagnosis not present

## 2018-10-16 DIAGNOSIS — Z0000 Encounter for general adult medical examination without abnormal findings: Secondary | ICD-10-CM

## 2018-10-16 LAB — POCT URINALYSIS DIPSTICK
Bilirubin, UA: NEGATIVE
Glucose, UA: NEGATIVE
Ketones, UA: NEGATIVE
Leukocytes, UA: NEGATIVE
Nitrite, UA: NEGATIVE
Protein, UA: NEGATIVE
Spec Grav, UA: 1.02 (ref 1.010–1.025)
Urobilinogen, UA: 0.2 E.U./dL
pH, UA: 7 (ref 5.0–8.0)

## 2018-10-16 NOTE — Patient Instructions (Signed)
Beryle Flock Maintenance, Female Adopting a healthy lifestyle and getting preventive care are important in promoting health and wellness. Ask your health care provider about:  The right schedule for you to have regular tests and exams.  Things you can do on your own to prevent diseases and keep yourself healthy. What should I know about diet, weight, and exercise? Eat a healthy diet   Eat a diet that includes plenty of vegetables, fruits, low-fat dairy products, and lean protein.  Do not eat a lot of foods that are high in solid fats, added sugars, or sodium. Maintain a healthy weight Body mass index (BMI) is used to identify weight problems. It estimates body fat based on height and weight. Your health care provider can help determine your BMI and help you achieve or maintain a healthy weight. Get regular exercise Get regular exercise. This is one of the most important things you can do for your health. Most adults should:  Exercise for at least 150 minutes each week. The exercise should increase your heart rate and make you sweat (moderate-intensity exercise).  Do strengthening exercises at least twice a week. This is in addition to the moderate-intensity exercise.  Spend less time sitting. Even light physical activity can be beneficial. Watch cholesterol and blood lipids Have your blood tested for lipids and cholesterol at 44 years of age, then have this test every 5 years. Have your cholesterol levels checked more often if:  Your lipid or cholesterol levels are high.  You are older than 44 years of age.  You are at high risk for heart disease. What should I know about cancer screening? Depending on your health history and family history, you may need to have cancer screening at various ages. This may include screening for:  Breast cancer.  Cervical cancer.  Colorectal cancer.  Skin cancer.  Lung cancer. What should I know about heart disease, diabetes, and high blood  pressure? Blood pressure and heart disease  High blood pressure causes heart disease and increases the risk of stroke. This is more likely to develop in people who have high blood pressure readings, are of African descent, or are overweight.  Have your blood pressure checked: ? Every 3-5 years if you are 30-57 years of age. ? Every year if you are 57 years old or older. Diabetes Have regular diabetes screenings. This checks your fasting blood sugar level. Have the screening done:  Once every three years after age 1 if you are at a normal weight and have a low risk for diabetes.  More often and at a younger age if you are overweight or have a high risk for diabetes. What should I know about preventing infection? Hepatitis B If you have a higher risk for hepatitis B, you should be screened for this virus. Talk with your health care provider to find out if you are at risk for hepatitis B infection. Hepatitis C Testing is recommended for:  Everyone born from [birth year] through 1966.  Anyone with known risk factors for hepatitis C. Sexually transmitted infections (STIs)  Get screened for STIs, including gonorrhea and chlamydia, if: ? You are sexually active and are younger than 44 years of age. ? You are older than 44 years of age and your health care provider tells you that you are at risk for this type of infection. ? Your sexual activity has changed since you were last screened, and you are at increased risk for chlamydia or gonorrhea. Ask your health care provider if  you are at risk.  Ask your health care provider about whether you are at high risk for HIV. Your health care provider may recommend a prescription medicine to help prevent HIV infection. If you choose to take medicine to prevent HIV, you should first get tested for HIV. You should then be tested every 3 months for as long as you are taking the medicine. Pregnancy  If you are about to stop having your period (premenopausal) and  you may become pregnant, seek counseling before you get pregnant.  Take 400 to 800 micrograms (mcg) of folic acid every day if you become pregnant.  Ask for birth control (contraception) if you want to prevent pregnancy. Osteoporosis and menopause Osteoporosis is a disease in which the bones lose minerals and strength with aging. This can result in bone fractures. If you are 65 years old or older, or if you are at risk for osteoporosis and fractures, ask your health care provider if you should:  Be screened for bone loss.  Take a calcium or vitamin D supplement to lower your risk of fractures.  Be given hormone replacement therapy (HRT) to treat symptoms of menopause. Follow these instructions at home: Lifestyle  Do not use any products that contain nicotine or tobacco, such as cigarettes, e-cigarettes, and chewing tobacco. If you need help quitting, ask your health care provider.  Do not use street drugs.  Do not share needles.  Ask your health care provider for help if you need support or information about quitting drugs. Alcohol use  Do not drink alcohol if: ? Your health care provider tells you not to drink. ? You are pregnant, may be pregnant, or are planning to become pregnant.  If you drink alcohol: ? Limit how much you use to 0-1 drink a day. ? Limit intake if you are breastfeeding.  Be aware of how much alcohol is in your drink. In the [place] one drink equals one 12 oz bottle of beer (355 mL), one 5 oz glass of wine (148 mL), or one 1 oz glass of hard liquor (44 mL). General instructions  Schedule regular health, dental, and eye exams.  Stay current with your vaccines.  Tell your health care provider if: ? You often feel depressed. ? You have ever been abused or do not feel safe at home. Summary  Adopting a healthy lifestyle and getting preventive care are important in promoting health and wellness.  Follow your health care provider's instructions about healthy  diet, exercising, and getting tested or screened for diseases.  Follow your health care provider's instructions on monitoring your cholesterol and blood pressure. This information is not intended to replace advice given to you by your health care provider. Make sure you discuss any questions you have with your health care provider. Document Released: 08/08/2010 Document Revised: 01/16/2018 Document Reviewed: 01/16/2018 Elsevier Patient Education  2020 Elsevier Inc.  

## 2018-10-16 NOTE — Progress Notes (Signed)
Subjective:     Patient ID: Sarah Shaw , female    DOB: [birth year] , 43 y.o.   MRN: [id number]   Chief Complaint  Patient presents with  . Annual Exam    HPI The patient states she uses none for birth control. Last LMP was Patient's last menstrual period was 10/08/2018.. Negative for Dysmenorrhea and Negative for Menorrhagia Mammogram last done 03/05/2018. Negative for: breast discharge, breast lump(s), breast pain and breast self exam.  Pertinent negatives include anxiety, decreased libido, depression, difficulty falling sleep, dyspareunia, history of infertility, nocturia, sexual dysfunction, sleep disturbances, urinary incontinence, urinary urgency, vaginal discharge and vaginal itching. Diet regular.The patient states her exercise level is  minimally, has downloaded [employer] app due to weight gain due to the quarantine.     The patient's tobacco use is:  Social History   Tobacco Use  Smoking Status Never Smoker  Smokeless Tobacco Never Used   She has been exposed to passive smoke. The patient's alcohol use is:  Social History   Substance and Sexual Activity  Alcohol Use No   Additional information: Has her PAPs done by [place], last done in Jan 2020.  Next due 2021.   Here for HM  Wt Readings from Last 3 Encounters: 10/16/18 : 222 lb (100.7 kg) 04/11/18 : 222 lb (100.7 kg) 05/19/11 : 217 lb (98.4 kg)     No past medical history on file.   No family history on file.  No current outpatient medications on file.   No Known Allergies   Review of Systems  Constitutional: Negative.   HENT: Negative.   Eyes: Negative.   Respiratory: Negative.   Cardiovascular: Negative.   Gastrointestinal: Negative.   Endocrine: Negative.   Genitourinary: Negative.   Musculoskeletal: Negative.   Skin: Negative.   Allergic/Immunologic: Negative.   Neurological: Negative.  Negative for dizziness and headaches.  Hematological: Negative.   Psychiatric/Behavioral:  Negative.      Today's Vitals   10/16/18 0914  BP: 128/84  Pulse: 96  Temp: 97.7 F (36.5 C)  TempSrc: Oral  Weight: 222 lb (100.7 kg)  Height: 5' 6.4" (1.687 m)  PainSc: 0-No pain   Body mass index is 35.4 kg/m.   Objective:  Physical Exam Vitals signs reviewed.  Constitutional:      Appearance: Normal appearance. She is well-developed.  HENT:     Head: Normocephalic and atraumatic.     Right Ear: Hearing, tympanic membrane, ear canal and external ear normal.     Left Ear: Hearing, tympanic membrane, ear canal and external ear normal.     Nose: Nose normal.     Mouth/Throat:     Mouth: Mucous membranes are moist.  Eyes:     General: Lids are normal.     Conjunctiva/sclera: Conjunctivae normal.     Pupils: Pupils are equal, round, and reactive to light.     Funduscopic exam:    Right eye: No papilledema.        Left eye: No papilledema.  Neck:     Musculoskeletal: Full passive range of motion without pain, normal range of motion and neck supple.     Thyroid: No thyroid mass.     Vascular: No carotid bruit.  Cardiovascular:     Rate and Rhythm: Normal rate and regular rhythm.     Pulses: Normal pulses.     Heart sounds: Normal heart sounds. No murmur.  Pulmonary:     Effort: Pulmonary effort is normal.  Breath sounds: Normal breath sounds.  Abdominal:     General: Abdomen is flat. Bowel sounds are normal.     Palpations: Abdomen is soft.  Musculoskeletal: Normal range of motion.        General: No swelling.     Right lower leg: No edema.     Left lower leg: No edema.  Skin:    General: Skin is warm and dry.     Capillary Refill: Capillary refill takes less than 2 seconds.  Neurological:     General: No focal deficit present.     Mental Status: She is alert and oriented to person, place, and time.     Cranial Nerves: No cranial nerve deficit.     Sensory: No sensory deficit.  Psychiatric:        Mood and Affect: Mood normal.        Behavior: Behavior  normal.        Thought Content: Thought content normal.        Judgment: Judgment normal.         Assessment And Plan:     1. Encounter for general adult medical examination w/o abnormal findings Behavior modifications discussed and diet history reviewed.   Pt will continue to exercise regularly and modify diet with low GI, plant based foods and decrease intake of processed foods.  Recommend intake of daily multivitamin, Vitamin D, and calcium.  Recommend mammogram and colonoscopy for preventive screenings, as well as recommend immunizations that include influenza, TDAP (she is to get a copy from [facility] at [facility]) Will obtain records from [place] for her most recent PAP  Influenza immunization was not given due to patient refusal, getting at the flu clinic at work. - POCT Urinalysis Dipstick (81002) - CMP14 + Anion Gap - CBC no Diff  2. Vitamin D deficiency  Will check vitamin D level and supplement as needed.     Also encouraged to spend 15 minutes in the sun daily.  - Vitamin D (25 hydroxy)  3. Prediabetes Chronic, controlled Continue with current medications Encouraged to limit intake of sugary foods and drinks Encouraged to increase physical activity to 150 minutes per week, she is to start the [employer] workout regimen  - CMP14 + Anion Gap - Lipid Profile - Hemoglobin A1c     Minette Brine, FNP    THE PATIENT IS ENCOURAGED TO PRACTICE SOCIAL DISTANCING DUE TO THE COVID-19 PANDEMIC.

## 2018-10-17 ENCOUNTER — Other Ambulatory Visit: Payer: Self-pay | Admitting: Nurse Practitioner

## 2018-10-17 DIAGNOSIS — E559 Vitamin D deficiency, unspecified: Secondary | ICD-10-CM

## 2018-10-17 LAB — LIPID PANEL
Chol/HDL Ratio: 4.1 ratio (ref 0.0–4.4)
Cholesterol, Total: 182 mg/dL (ref 100–199)
HDL: 44 mg/dL (ref >39)
LDL Chol Calc (NIH): 121 mg/dL — ABNORMAL HIGH (ref 0–99)
Triglycerides: 93 mg/dL (ref 0–149)
VLDL Cholesterol Cal: 17 mg/dL (ref 5–40)

## 2018-10-17 LAB — CMP14 + ANION GAP
ALT: 13 IU/L (ref 0–32)
AST: 21 IU/L (ref 0–40)
Albumin/Globulin Ratio: 1.6 (ref 1.2–2.2)
Albumin: 4.3 g/dL (ref 3.8–4.8)
Alkaline Phosphatase: 53 IU/L (ref 39–117)
Anion Gap: 14 mmol/L (ref 10.0–18.0)
BUN/Creatinine Ratio: 10 (ref 9–23)
BUN: 10 mg/dL (ref 6–24)
Bilirubin Total: 0.5 mg/dL (ref 0.0–1.2)
CO2: 21 mmol/L (ref 20–29)
Calcium: 9.2 mg/dL (ref 8.7–10.2)
Chloride: 105 mmol/L (ref 96–106)
Creatinine, Ser: 0.97 mg/dL (ref 0.57–1.00)
GFR calc Af Amer: 83 mL/min/{1.73_m2} (ref >59)
GFR calc non Af Amer: 72 mL/min/{1.73_m2} (ref >59)
Globulin, Total: 2.7 g/dL (ref 1.5–4.5)
Glucose: 100 mg/dL — ABNORMAL HIGH (ref 65–99)
Potassium: 3.9 mmol/L (ref 3.5–5.2)
Sodium: 140 mmol/L (ref 134–144)
Total Protein: 7 g/dL (ref 6.0–8.5)

## 2018-10-17 LAB — CBC
Hematocrit: 36.3 % (ref 34.0–46.6)
Hemoglobin: 11 g/dL — ABNORMAL LOW (ref 11.1–15.9)
MCH: 22.5 pg — ABNORMAL LOW (ref 26.6–33.0)
MCHC: 30.3 g/dL — ABNORMAL LOW (ref 31.5–35.7)
MCV: 74 fL — ABNORMAL LOW (ref 79–97)
Platelets: 328 10*3/uL (ref 150–450)
RBC: 4.88 x10E6/uL (ref 3.77–5.28)
RDW: 15.4 % (ref 11.7–15.4)
WBC: 6.7 10*3/uL (ref 3.4–10.8)

## 2018-10-17 LAB — VITAMIN D 25 HYDROXY (VIT D DEFICIENCY, FRACTURES): Vit D, 25-Hydroxy: 15.5 ng/mL — ABNORMAL LOW (ref 30.0–100.0)

## 2018-10-17 LAB — HEMOGLOBIN A1C
Est. average glucose Bld gHb Est-mCnc: 117 mg/dL
Hgb A1c MFr Bld: 5.7 % — ABNORMAL HIGH (ref 4.8–5.6)

## 2018-10-17 LAB — HIV ANTIBODY (ROUTINE TESTING W REFLEX): HIV Screen 4th Generation wRfx: NONREACTIVE

## 2018-10-17 MED ORDER — VITAMIN D (ERGOCALCIFEROL) 1.25 MG (50000 UNIT) PO CAPS: ORAL_CAPSULE | ORAL | 0 refills | Status: DC

## 2018-10-17 NOTE — Progress Notes (Signed)
v

## 2018-11-19 DIAGNOSIS — N920 Excessive and frequent menstruation with regular cycle: Secondary | ICD-10-CM | POA: Diagnosis not present

## 2018-11-21 DIAGNOSIS — N920 Excessive and frequent menstruation with regular cycle: Secondary | ICD-10-CM | POA: Insufficient documentation

## 2018-11-21 DIAGNOSIS — D649 Anemia, unspecified: Secondary | ICD-10-CM | POA: Insufficient documentation

## 2018-11-21 DIAGNOSIS — D509 Iron deficiency anemia, unspecified: Secondary | ICD-10-CM | POA: Insufficient documentation

## 2018-11-26 DIAGNOSIS — D649 Anemia, unspecified: Secondary | ICD-10-CM | POA: Diagnosis not present

## 2018-11-26 DIAGNOSIS — D259 Leiomyoma of uterus, unspecified: Secondary | ICD-10-CM | POA: Diagnosis not present

## 2018-11-26 DIAGNOSIS — N920 Excessive and frequent menstruation with regular cycle: Secondary | ICD-10-CM | POA: Diagnosis not present

## 2019-01-16 ENCOUNTER — Ambulatory Visit: Payer: Self-pay | Admitting: Nurse Practitioner

## 2019-01-20 ENCOUNTER — Encounter: Payer: Self-pay | Admitting: Nurse Practitioner

## 2019-01-20 ENCOUNTER — Other Ambulatory Visit: Payer: Self-pay

## 2019-01-20 ENCOUNTER — Ambulatory Visit: Payer: 59 | Admitting: Nurse Practitioner

## 2019-01-20 VITALS — BP 122/80 | HR 86 | Temp 98.0°F | Ht 66.0 in | Wt 217.8 lb

## 2019-01-20 DIAGNOSIS — M79605 Pain in left leg: Secondary | ICD-10-CM

## 2019-01-20 NOTE — Progress Notes (Signed)
This visit occurred during the SARS-CoV-2 public health emergency.  Safety protocols were in place, including screening questions prior to the visit, additional usage of staff PPE, and extensive cleaning of exam room while observing appropriate contact time as indicated for disinfecting solutions.  Subjective:     Patient ID: Sarah Shaw , female    DOB: Aug 18, 1975 , 43 y.o.   MRN: [id number]   Chief Complaint  Patient presents with  . Leg Swelling    patient stated she is having pain and swelling in her left leg that started on thursday.     HPI  Here today with a 4 day history of left leg pain and swelling. She had been sitting for longer than usual last week, denies shortness.  States "this is a different feeling"  Left leg has pain that she is unable to describe.  Denies smoking.  She was on her menstrual cycle for 16 days, she started on norestradiol for 2-3 days then stopped [facility]. She is not taking an aspirin daily.   Has been elevating over the weekend.    Leg Pain  The incident occurred 3 to 5 days ago. There was no injury mechanism. Pain location: left calf area. The quality of the pain is described as aching. Pertinent negatives include no inability to bear weight. Associated symptoms comments: Burning sensation.  . She has tried elevation and rest for the symptoms. The treatment provided mild relief.     No past medical history on file.   No family history on file.   Current Outpatient Medications:  Marland Kitchen  Vitamin D, Ergocalciferol, (DRISDOL) 1.25 MG (50000 UT) CAPS capsule, Take 1 capsule by mouth two times a week., Disp: 24 capsule, Rfl: 0   No Known Allergies   Review of Systems  Constitutional: Negative.   Respiratory: Negative.   Cardiovascular: Negative.  Negative for chest pain, palpitations and leg swelling.  Musculoskeletal:       Left lower leg pain and swelling  Neurological: Negative for dizziness and headaches.     Today's Vitals   01/20/19 1504  BP: 122/80  Pulse: 86  Temp: 98 F (36.7 C)  TempSrc: Oral  Weight: 217 lb 12.8 oz (98.8 kg)  Height: 5\' 6"  (1.676 m)  PainSc: 5   PainLoc: Leg   Body mass index is 35.15 kg/m.   Objective:  Physical Exam Constitutional:      General: She is not in acute distress.    Appearance: Normal appearance.  Cardiovascular:     Rate and Rhythm: Normal rate and regular rhythm.     Pulses: Normal pulses.     Heart sounds: Normal heart sounds. No murmur.  Pulmonary:     Effort: Pulmonary effort is normal. No respiratory distress.     Breath sounds: Normal breath sounds.  Neurological:     General: No focal deficit present.     Mental Status: She is alert and oriented to person, place, and time.  Psychiatric:        Mood and Affect: Mood normal.        Behavior: Behavior normal.        Thought Content: Thought content normal.        Judgment: Judgment normal.         Assessment And Plan:     1. Left leg pain  Sudden onset left lower extremity discomfort with mild swelling, slightly firm to touch with slight warmth  She is advised to take aspirin  81 mg daily pending results will start on Xarelto  Avoid strenuous activity.   Will check d-dimer  Denies shortness of breath. If has shortness of breath or chest pain she is to go to ER.  - D-dimer, quantitative (not at [facility]) - VAS [place] LOWER EXTREMITY VENOUS (DVT); Future   Minette Brine, FNP    THE PATIENT IS ENCOURAGED TO PRACTICE SOCIAL DISTANCING DUE TO THE COVID-19 PANDEMIC.

## 2019-01-20 NOTE — Patient Instructions (Signed)
   Take aspirin 81 mg daily  Avoid jumping or strenuous activity  Go to [facility] for venous doppler tomorrow at [employer].

## 2019-01-21 ENCOUNTER — Encounter: Payer: Self-pay | Admitting: Nurse Practitioner

## 2019-01-21 ENCOUNTER — Ambulatory Visit (HOSPITAL_COMMUNITY)
Admission: RE | Admit: 2019-01-21 | Discharge: 2019-01-21 | Disposition: A | Discharge status: Home / Self Care | Payer: 59 | Source: Ambulatory Visit | Attending: Nurse Practitioner | Admitting: Nurse Practitioner

## 2019-01-21 DIAGNOSIS — M79605 Pain in left leg: Secondary | ICD-10-CM | POA: Diagnosis not present

## 2019-01-21 LAB — D-DIMER, QUANTITATIVE: D-DIMER: 0.75 mg/L FEU — ABNORMAL HIGH (ref 0.00–0.49)

## 2019-01-21 NOTE — Progress Notes (Addendum)
Lower extremity venous has been completed.   Preliminary results in CV Proc.   Attempted to call results, no answer.  Abram Sander 01/21/2019 9:06 AM

## 2019-01-23 DIAGNOSIS — D259 Leiomyoma of uterus, unspecified: Secondary | ICD-10-CM | POA: Insufficient documentation

## 2019-01-24 DIAGNOSIS — Z6833 Body mass index (BMI) 33.0-33.9, adult: Secondary | ICD-10-CM | POA: Diagnosis not present

## 2019-01-24 DIAGNOSIS — Z124 Encounter for screening for malignant neoplasm of cervix: Secondary | ICD-10-CM | POA: Diagnosis not present

## 2019-01-24 DIAGNOSIS — Z01419 Encounter for gynecological examination (general) (routine) without abnormal findings: Secondary | ICD-10-CM | POA: Diagnosis not present

## 2019-01-25 LAB — HM PAP SMEAR: HM Pap smear: NORMAL

## 2019-01-27 ENCOUNTER — Encounter: Payer: Self-pay | Admitting: Nurse Practitioner

## 2019-01-27 ENCOUNTER — Other Ambulatory Visit: Payer: Self-pay

## 2019-01-27 ENCOUNTER — Ambulatory Visit: Payer: 59 | Admitting: Nurse Practitioner

## 2019-01-27 VITALS — BP 110/80 | HR 82 | Temp 97.2°F | Ht 66.0 in | Wt 216.8 lb

## 2019-01-27 DIAGNOSIS — R7303 Prediabetes: Secondary | ICD-10-CM | POA: Diagnosis not present

## 2019-01-27 DIAGNOSIS — M79605 Pain in left leg: Secondary | ICD-10-CM | POA: Diagnosis not present

## 2019-01-27 DIAGNOSIS — E559 Vitamin D deficiency, unspecified: Secondary | ICD-10-CM

## 2019-01-27 MED ORDER — VITAMIN D (ERGOCALCIFEROL) 1.25 MG (50000 UNIT) PO CAPS: ORAL_CAPSULE | ORAL | 0 refills | Status: DC

## 2019-01-27 NOTE — Patient Instructions (Signed)
Vitamin B12 Deficiency Vitamin B12 deficiency occurs when the body does not have enough vitamin B12, which is an important vitamin. The body needs this vitamin:  To make red blood cells.  To make DNA. This is the genetic material inside cells.  To help the nerves work properly so they can carry messages from the brain to the body. Vitamin B12 deficiency can cause various health problems, such as a low red blood cell count (anemia) or nerve damage. What are the causes? This condition may be caused by:  Not eating enough foods that contain vitamin B12.  Not having enough stomach acid and digestive fluids to properly absorb vitamin B12 from the food that you eat.  Certain digestive system diseases that make it hard to absorb vitamin B12. These diseases include Crohn's disease, chronic pancreatitis, and cystic fibrosis.  A condition in which the body does not make enough of a protein (intrinsic factor), resulting in too few red blood cells (pernicious anemia).  Having a surgery in which part of the stomach or small intestine is removed.  Taking certain medicines that make it hard for the body to absorb vitamin B12. These medicines include: ? Heartburn medicines (antacids and proton pump inhibitors). ? Certain antibiotic medicines. ? Some medicines that are used to treat diabetes, tuberculosis, gout, or high cholesterol. What increases the risk? The following factors may make you more likely to develop a B12 deficiency:  Being older than age 28.  Eating a vegetarian or vegan diet, especially while you are pregnant.  Eating a poor diet while you are pregnant.  Taking certain medicines.  Having alcoholism. What are the signs or symptoms? In some cases, there are no symptoms of this condition. If the condition leads to anemia or nerve damage, various symptoms can occur, such as:  Weakness.  Fatigue.  Loss of appetite.  Weight loss.  Numbness or tingling in your hands and  feet.  Redness and burning of the tongue.  Confusion or memory problems.  Depression.  Sensory problems, such as color blindness, ringing in the ears, or loss of taste.  Diarrhea or constipation.  Trouble walking. If anemia is severe, symptoms can include:  Shortness of breath.  Dizziness.  Rapid heart rate (tachycardia). How is this diagnosed? This condition may be diagnosed with a blood test to measure the level of vitamin B12 in your blood. You may also have other tests, including:  A group of tests that measure certain characteristics of blood cells (complete blood count, CBC).  A blood test to measure intrinsic factor.  A procedure where a thin tube with a camera on the end is used to look into your stomach or intestines (endoscopy). Other tests may be needed to discover the cause of B12 deficiency. How is this treated? Treatment for this condition depends on the cause. This condition may be treated by:  Changing your eating and drinking habits, such as: ? Eating more foods that contain vitamin B12. ? Drinking less alcohol or no alcohol.  Getting vitamin B12 injections.  Taking vitamin B12 supplements. Your health care provider will tell you which dosage is best for you. Follow these instructions at home: Eating and drinking   Eat lots of healthy foods that contain vitamin B12, including: ? Meats and poultry. This includes beef, pork, chicken, [place], and organ meats, such as liver. ? Seafood. This includes clams, rainbow trout, salmon, tuna, and haddock. ? Eggs. ? Cereal and dairy products that are fortified. This means that vitamin B12  has been added to the food. Check the label on the package to see if the food is fortified. The items listed above may not be a complete list of recommended foods and beverages. Contact a dietitian for more information. General instructions  Get any injections that are prescribed by your health care provider.  Take  supplements only as told by your health care provider. Follow the directions carefully.  Do not drink alcohol if your health care provider tells you not to. In some cases, you may only be asked to limit alcohol use.  Keep all follow-up visits as told by your health care provider. This is important. Contact a health care provider if:  Your symptoms come back. Get help right away if you:  Develop shortness of breath.  Have a rapid heart rate.  Have chest pain.  Become dizzy or lose consciousness. Summary  Vitamin B12 deficiency occurs when the body does not have enough vitamin B12.  The main causes of vitamin B12 deficiency include dietary deficiency, digestive diseases, pernicious anemia, and having a surgery in which part of the stomach or small intestine is removed.  In some cases, there are no symptoms of this condition. If the condition leads to anemia or nerve damage, various symptoms can occur, such as weakness, shortness of breath, and numbness.  Treatment may include getting vitamin B12 injections or taking vitamin B12 supplements. Eat lots of healthy foods that contain vitamin B12. This information is not intended to replace advice given to you by your health care provider. Make sure you discuss any questions you have with your health care provider. Document Released: 04/17/2011 Document Revised: 10/02/2017 Document Reviewed: 10/02/2017 Elsevier Patient Education  2020 [employer].

## 2019-01-27 NOTE — Progress Notes (Signed)
This visit occurred during the SARS-CoV-2 public health emergency.  Safety protocols were in place, including screening questions prior to the visit, additional usage of staff PPE, and extensive cleaning of exam room while observing appropriate contact time as indicated for disinfecting solutions.  Subjective:     Patient ID: Sarah Shaw , female    DOB: 05/25/1975 , 43 y.o.   MRN: [id number]   Chief Complaint  Patient presents with  . VITAMIN D RECHECK    HPI  Here for vitamin d follow up, she is taking vitamin d 50,000 units twice a week. Denies joint pain   Reports her leg is better after taking aspirin for the last week     No past medical history on file.   No family history on file.   Current Outpatient Medications:  Marland Kitchen  Vitamin D, Ergocalciferol, (DRISDOL) 1.25 MG (50000 UT) CAPS capsule, Take 1 capsule by mouth two times a week., Disp: 24 capsule, Rfl: 0   No Known Allergies   Review of Systems  Constitutional: Negative.   Respiratory: Negative.   Cardiovascular: Negative.  Negative for chest pain, palpitations and leg swelling.  Neurological: Negative for dizziness and headaches.  Psychiatric/Behavioral: Negative.      Today's Vitals   01/27/19 0841  BP: 110/80  Pulse: 82  Temp: (!) 97.2 F (36.2 C)  TempSrc: Oral  Weight: 216 lb 12.8 oz (98.3 kg)  Height: 5\' 6"  (1.676 m)  PainSc: 0-No pain   Body mass index is 34.99 kg/m.   Objective:  Physical Exam Vitals reviewed.  Constitutional:      General: She is not in acute distress.    Appearance: Normal appearance. She is obese.  Cardiovascular:     Rate and Rhythm: Normal rate and regular rhythm.     Pulses: Normal pulses.     Heart sounds: Normal heart sounds. No murmur.  Pulmonary:     Effort: Pulmonary effort is normal. No respiratory distress.     Breath sounds: Normal breath sounds.  Neurological:     General: No focal deficit present.     Mental Status: She is alert and oriented to  person, place, and time.  Psychiatric:        Mood and Affect: Mood normal.        Behavior: Behavior normal.        Thought Content: Thought content normal.        Judgment: Judgment normal.          Assessment And Plan:     1. Vitamin D deficiency Will check vitamin D level and supplement as needed.    Also encouraged to spend 15 minutes in the sun daily.  - Vitamin D (25 hydroxy) - Vitamin D, Ergocalciferol, (DRISDOL) 1.25 MG (50000 UT) CAPS capsule; Take 1 capsule by mouth two times a week.  Dispense: 24 capsule; Refill: 0  2. Prediabetes  Chronic, controlled  Continue with current medications  Encouraged to limit intake of sugary foods and drinks  Encouraged to increase physical activity, eat healthy diet low in sugary and starches and increase fiber intake. - Hemoglobin A1c  3. Left leg pain  Doppler done last week was negative for DVT, she reports this is better after taking the aspirin for one week until yesterday began causing GI upset.    She can stop taking for now as she does not have any risk factors.  She will get a copy of her tetanus immunization from [facility] at  [facility].  She also reports receiving her PAP at [facility] last week, no records have been received at this time.     Minette Brine, FNP    THE PATIENT IS ENCOURAGED TO PRACTICE SOCIAL DISTANCING DUE TO THE COVID-19 PANDEMIC.

## 2019-01-28 LAB — HEMOGLOBIN A1C
Est. average glucose Bld gHb Est-mCnc: 123 mg/dL
Hgb A1c MFr Bld: 5.9 % — ABNORMAL HIGH (ref 4.8–5.6)

## 2019-01-28 LAB — VITAMIN D 25 HYDROXY (VIT D DEFICIENCY, FRACTURES): Vit D, 25-Hydroxy: 12.3 ng/mL — ABNORMAL LOW (ref 30.0–100.0)

## 2019-03-04 DIAGNOSIS — D485 Neoplasm of uncertain behavior of skin: Secondary | ICD-10-CM | POA: Diagnosis not present

## 2019-03-24 DIAGNOSIS — Z1231 Encounter for screening mammogram for malignant neoplasm of breast: Secondary | ICD-10-CM | POA: Diagnosis not present

## 2019-04-15 ENCOUNTER — Ambulatory Visit: Payer: 59 | Admitting: Nurse Practitioner

## 2019-05-12 ENCOUNTER — Ambulatory Visit: Payer: 59 | Admitting: Nurse Practitioner

## 2019-06-30 ENCOUNTER — Ambulatory Visit: Payer: 59 | Admitting: Nurse Practitioner

## 2019-06-30 ENCOUNTER — Other Ambulatory Visit: Payer: Self-pay

## 2019-06-30 ENCOUNTER — Encounter: Payer: Self-pay | Admitting: Nurse Practitioner

## 2019-06-30 VITALS — BP 130/88 | HR 78 | Temp 97.7°F | Ht 65.8 in | Wt 226.2 lb

## 2019-06-30 DIAGNOSIS — R7303 Prediabetes: Secondary | ICD-10-CM | POA: Diagnosis not present

## 2019-06-30 DIAGNOSIS — E559 Vitamin D deficiency, unspecified: Secondary | ICD-10-CM

## 2019-06-30 DIAGNOSIS — H9201 Otalgia, right ear: Secondary | ICD-10-CM | POA: Diagnosis not present

## 2019-06-30 DIAGNOSIS — H6122 Impacted cerumen, left ear: Secondary | ICD-10-CM

## 2019-06-30 NOTE — Progress Notes (Signed)
This visit occurred during the SARS-CoV-2 public health emergency.  Safety protocols were in place, including screening questions prior to the visit, additional usage of staff PPE, and extensive cleaning of exam room while observing appropriate contact time as indicated for disinfecting solutions.  Subjective:     Patient ID: Sarah Shaw , female    DOB: [birth year] , 44 y.o.   MRN: [id number]   Chief Complaint  Patient presents with  . vitamin d    HPI  Here for vitamin d follow up, she is taking 1000 units. She is eating the same amount of calcium and is going outside. She is doing indoor 4 times per weeks. She stopped the 50,000 twice per week and stopped due to a rash.   She is has complaints of ear pain. She states that the pain is waxing and waning and worse in the a.m. She has lavaged the ears and has used Q tips, if she slept on that side. The pain is a 7/10 and has not tried any medications.   She is planning to get the Covid vaccine in the future bur hesitant for now, She was informed that she can get the vaccination here in the clinic.  Wt Readings from Last 3 Encounters: 06/30/19 : 226 lb 3.2 oz (102.6 kg) 01/27/19 : 216 lb 12.8 oz (98.3 kg) 01/20/19 : 217 lb 12.8 oz (98.8 kg)  She is doing cardio and toning for exercise. She is cutting out on sodas and white breads/sweets. She is drinking approximately 72 ounces of water daily.      No past medical history on file.   Family History  Problem Relation Age of Onset  . Healthy Mother   . Cancer Father      Current Outpatient Medications:  Marland Kitchen  Vitamin D, Ergocalciferol, (DRISDOL) 1.25 MG (50000 UT) CAPS capsule, Take 1 capsule by mouth two times a week., Disp: 24 capsule, Rfl: 0   No Known Allergies   Review of Systems  Constitutional: Negative.   HENT: Positive for ear pain (right ear posterior).        Right Ear and pain in the back of the neck noted  Eyes: Negative.   Respiratory: Negative.    Cardiovascular: Negative.  Negative for chest pain, palpitations and leg swelling.  Gastrointestinal: Negative.   Endocrine: Negative.   Genitourinary: Negative.   Musculoskeletal: Positive for neck pain.  Skin: Negative.   Allergic/Immunologic: Negative.   Neurological: Negative.  Negative for dizziness and headaches.  Hematological: Negative.   Psychiatric/Behavioral: Negative.      Today's Vitals   06/30/19 1627  BP: 130/88  Pulse: 78  Temp: 97.7 F (36.5 C)  TempSrc: Oral  Weight: 226 lb 3.2 oz (102.6 kg)  Height: 5' 5.8" (1.671 m)   Body mass index is 36.73 kg/m.   Objective:  Physical Exam Vitals reviewed.  Constitutional:      General: She is not in acute distress.    Appearance: Normal appearance. She is obese.  HENT:     Head: Normocephalic.     Right Ear: Tympanic membrane, ear canal and external ear normal. There is no impacted cerumen.     Left Ear: Tympanic membrane, ear canal and external ear normal. There is impacted cerumen.  Cardiovascular:     Rate and Rhythm: Normal rate and regular rhythm.     Pulses: Normal pulses.     Heart sounds: Normal heart sounds. No murmur.  Pulmonary:     Effort: Pulmonary  effort is normal. No respiratory distress.     Breath sounds: Normal breath sounds.  Musculoskeletal:        General: Normal range of motion.     Cervical back: Tenderness present.  Lymphadenopathy:     Cervical: No cervical adenopathy.  Skin:    General: Skin is dry.  Neurological:     General: No focal deficit present.     Mental Status: She is alert and oriented to person, place, and time.  Psychiatric:        Mood and Affect: Mood normal.        Behavior: Behavior normal.        Thought Content: Thought content normal.        Judgment: Judgment normal.          Assessment And Plan:     1. Vitamin D deficiency Will check vitamin D level and supplement as needed.    Also encouraged to spend 15 minutes in the sun daily.  - Vitamin D  (25 hydroxy) - Vitamin D, Ergocalciferol, (DRISDOL) 1.25 MG (50000 UT) CAPS capsule; Take 1 capsule by mouth two times a week.  Dispense: 24 capsule; Refill: 0  2. Prediabetes  Chronic, controlled  Continue with current medications  Encouraged to limit intake of sugary foods and drinks  Encouraged to increase physical activity, eat healthy diet low in sugary and starches and increase fiber intake. - Hemoglobin A1c  3. Right ear pain  Ear canal is clear, tenderness to posterior cervical adenopathy  Will check blood levels pending results will consider referral to ENT  She is to take zyrtec at bedtime to see if effective - CBC with Differential/Platelet  4. Impacted cerumen of left ear  Cerumen impaction, she will do a water lavage at home    Marylu Lund, RN   Minette Brine, DNP, FNP-BC THE PATIENT IS ENCOURAGED TO PRACTICE SOCIAL DISTANCING DUE TO THE COVID-19 PANDEMIC.

## 2019-07-01 LAB — CBC WITH DIFFERENTIAL/PLATELET
Basophils Absolute: 0 10*3/uL (ref 0.0–0.2)
Basos: 0 %
EOS (ABSOLUTE): 0.2 10*3/uL (ref 0.0–0.4)
Eos: 2 %
Hematocrit: 31.1 % — ABNORMAL LOW (ref 34.0–46.6)
Hemoglobin: 10 g/dL — ABNORMAL LOW (ref 11.1–15.9)
Immature Grans (Abs): 0 10*3/uL (ref 0.0–0.1)
Immature Granulocytes: 0 %
Lymphocytes Absolute: 2.5 10*3/uL (ref 0.7–3.1)
Lymphs: 26 %
MCH: 22.7 pg — ABNORMAL LOW (ref 26.6–33.0)
MCHC: 32.2 g/dL (ref 31.5–35.7)
MCV: 71 fL — ABNORMAL LOW (ref 79–97)
Monocytes Absolute: 0.5 10*3/uL (ref 0.1–0.9)
Monocytes: 5 %
Neutrophils Absolute: 6.2 10*3/uL (ref 1.4–7.0)
Neutrophils: 67 %
Platelets: 316 10*3/uL (ref 150–450)
RBC: 4.41 x10E6/uL (ref 3.77–5.28)
RDW: 15.2 % (ref 11.7–15.4)
WBC: 9.4 10*3/uL (ref 3.4–10.8)

## 2019-07-01 LAB — HEMOGLOBIN A1C
Est. average glucose Bld gHb Est-mCnc: 123 mg/dL
Hgb A1c MFr Bld: 5.9 % — ABNORMAL HIGH (ref 4.8–5.6)

## 2019-07-01 LAB — VITAMIN D 25 HYDROXY (VIT D DEFICIENCY, FRACTURES): Vit D, 25-Hydroxy: 13.9 ng/mL — ABNORMAL LOW (ref 30.0–100.0)

## 2019-10-21 ENCOUNTER — Encounter: Payer: Self-pay | Admitting: Nurse Practitioner

## 2019-10-21 NOTE — Progress Notes (Deleted)
  Rutherford Nail as a scribe for Minette Brine, FNP.,have documented all relevant documentation on the behalf of Minette Brine, FNP,as directed by  Minette Brine, FNP while in the presence of Minette Brine, [place]. This visit occurred during the SARS-CoV-2 public health emergency.  Safety protocols were in place, including screening questions prior to the visit, additional usage of staff PPE, and extensive cleaning of exam room while observing appropriate contact time as indicated for disinfecting solutions.  Subjective:     Patient ID: Sarah Shaw , female    DOB: 1975/08/05 , 44 y.o.   MRN: [id number]   Chief Complaint  Patient presents with  . Annual Exam    HPI  Here for HM Wt Readings from Last 3 Encounters: 06/30/19 : 226 lb 3.2 oz (102.6 kg) 01/27/19 : 216 lb 12.8 oz (98.3 kg) 01/20/19 : 217 lb 12.8 oz (98.8 kg)     History reviewed. No pertinent past medical history.   Family History  Problem Relation Age of Onset  . Healthy Mother   . Cancer Father     No current outpatient medications on file.   No Known Allergies    The patient states she uses {contraceptive methods:5051} for birth control. Last LMP was No LMP recorded.. {Dysmenorrhea-menorrhagia:21918}. Negative for: breast discharge, breast lump(s), breast pain and breast self exam. Associated symptoms include abnormal vaginal bleeding. Pertinent negatives include abnormal bleeding (hematology), anxiety, decreased libido, depression, difficulty falling sleep, dyspareunia, history of infertility, nocturia, sexual dysfunction, sleep disturbances, urinary incontinence, urinary urgency, vaginal discharge and vaginal itching. Diet regular.The patient states her exercise level is    . The patient's tobacco use is:  Social History   Tobacco Use  Smoking Status Never Smoker  Smokeless Tobacco Never Used  . She has been exposed to passive smoke. The patient's alcohol use is:  Social History   Substance and  Sexual Activity  Alcohol Use No  . Additional information: Last pap ***, next one scheduled for ***.    Review of Systems  Constitutional: Negative.  Negative for fatigue.  HENT: Negative.   Eyes: Negative.   Respiratory: Negative.   Cardiovascular: Negative.   Gastrointestinal: Negative.   Endocrine: Negative for polydipsia, polyphagia and polyuria.  Musculoskeletal: Negative.   Skin: Negative.   Neurological: Negative for dizziness and headaches.  Psychiatric/Behavioral: Negative.      There were no vitals filed for this visit. There is no height or weight on file to calculate BMI.   Objective:  Physical Exam      Assessment And Plan:     1. Annual physical exam     Patient was given opportunity to ask questions. Patient verbalized understanding of the plan and was able to repeat key elements of the plan. All questions were answered to their satisfaction.   Hassell Done, CMA   I, Hassell Done, CMA, have reviewed all documentation for this visit. The documentation on 10/21/19 for the exam, diagnosis, procedures, and orders are all accurate and complete.  THE PATIENT IS ENCOURAGED TO PRACTICE SOCIAL DISTANCING DUE TO THE COVID-19 PANDEMIC.

## 2019-10-22 ENCOUNTER — Other Ambulatory Visit: Payer: Self-pay

## 2019-10-22 ENCOUNTER — Ambulatory Visit (INDEPENDENT_AMBULATORY_CARE_PROVIDER_SITE_OTHER): Payer: 59 | Admitting: Nurse Practitioner

## 2019-10-22 ENCOUNTER — Encounter: Payer: Self-pay | Admitting: Nurse Practitioner

## 2019-10-22 VITALS — BP 114/70 | HR 88 | Temp 97.9°F | Ht 65.8 in | Wt 227.4 lb

## 2019-10-22 DIAGNOSIS — Z1159 Encounter for screening for other viral diseases: Secondary | ICD-10-CM | POA: Diagnosis not present

## 2019-10-22 DIAGNOSIS — E7800 Pure hypercholesterolemia, unspecified: Secondary | ICD-10-CM

## 2019-10-22 DIAGNOSIS — E559 Vitamin D deficiency, unspecified: Secondary | ICD-10-CM | POA: Diagnosis not present

## 2019-10-22 DIAGNOSIS — Z0000 Encounter for general adult medical examination without abnormal findings: Secondary | ICD-10-CM | POA: Diagnosis not present

## 2019-10-22 DIAGNOSIS — Z800 Family history of malignant neoplasm of digestive organs: Secondary | ICD-10-CM | POA: Diagnosis not present

## 2019-10-22 DIAGNOSIS — R7303 Prediabetes: Secondary | ICD-10-CM | POA: Diagnosis not present

## 2019-10-22 LAB — POCT URINALYSIS DIPSTICK
Bilirubin, UA: NEGATIVE
Glucose, UA: NEGATIVE
Ketones, UA: NEGATIVE
Leukocytes, UA: NEGATIVE
Nitrite, UA: NEGATIVE
Protein, UA: NEGATIVE
Spec Grav, UA: >=1.03 — AB (ref 1.010–1.025)
Urobilinogen, UA: 0.2 E.U./dL
pH, UA: 6.5 (ref 5.0–8.0)

## 2019-10-22 LAB — POCT UA - MICROALBUMIN
Albumin/Creatinine Ratio, Urine, POC: 30
Creatinine, POC: 300 mg/dL
Microalbumin Ur, POC: 30 mg/L

## 2019-10-22 NOTE — Progress Notes (Signed)
This visit occurred during the SARS-CoV-2 public health emergency.  Safety protocols were in place, including screening questions prior to the visit, additional usage of staff PPE, and extensive cleaning of exam room while observing appropriate contact time as indicated for disinfecting solutions.  Subjective:     Patient ID: Sarah Shaw , female    DOB: 06-17-1975 , 44 y.o.   MRN: [id number]   Chief Complaint  Patient presents with  . Annual Exam    HPI  Here for HM  Wt Readings from Last 3 Encounters: 10/22/19 : 227 lb 6.4 oz (103.1 kg) 06/30/19 : 226 lb 3.2 oz (102.6 kg) 01/27/19 : 216 lb 12.8 oz (98.3 kg)  August 26th LMP     History reviewed. No pertinent past medical history.   Family History  Problem Relation Age of Onset  . Healthy Mother   . Cancer Father     No current outpatient medications on file.   No Known Allergies    The patient states her husbsnd has a  vasectomy for birth control.  Negative for Dysmenorrhea and Negative for Menorrhagia. Negative for: breast discharge, breast lump(s), breast pain and breast self exam. Associated symptoms include abnormal vaginal bleeding. Pertinent negatives include abnormal bleeding (hematology), anxiety, decreased libido, depression, difficulty falling sleep, dyspareunia, history of infertility, nocturia, sexual dysfunction, sleep disturbances, urinary incontinence, urinary urgency, vaginal discharge and vaginal itching. Diet regular; low salt.  The patient states her exercise level is moderate 3 times a week.  The patient's tobacco use is:  She has been exposed to passive smoke. The patient's alcohol use is:  Social History   Substance and Sexual Activity  Alcohol Use No  . Additional information: Last pap 01/25/2019 next one scheduled for 02/03/2022 at [facility].    Review of Systems  Constitutional: Negative.   HENT: Negative.   Eyes: Negative.   Respiratory: Negative.   Cardiovascular: Negative.    Gastrointestinal: Negative.   Endocrine: Negative.   Genitourinary: Negative.   Musculoskeletal: Negative.   Skin: Negative.   Allergic/Immunologic: Negative.   Neurological: Negative.   Hematological: Negative.   Psychiatric/Behavioral: Negative.      Today's Vitals   10/22/19 0844  BP: 114/70  Pulse: 88  Temp: 97.9 F (36.6 C)  TempSrc: Oral  Weight: 227 lb 6.4 oz (103.1 kg)  Height: 5' 5.8" (1.671 m)  PainSc: 0-No pain   Body mass index is 36.93 kg/m.   Objective:  Physical Exam Constitutional:      General: She is not in acute distress.    Appearance: Normal appearance. She is well-developed. She is obese.  HENT:     Head: Normocephalic and atraumatic.     Right Ear: Hearing, tympanic membrane, ear canal and external ear normal. There is no impacted cerumen.     Left Ear: Hearing, tympanic membrane, ear canal and external ear normal. There is no impacted cerumen.     Nose:     Comments: Deferred - masked    Mouth/Throat:     Comments: Deferred - masked Eyes:     General: Lids are normal.     Extraocular Movements: Extraocular movements intact.     Conjunctiva/sclera: Conjunctivae normal.     Pupils: Pupils are equal, round, and reactive to light.     Funduscopic exam:    Right eye: No papilledema.        Left eye: No papilledema.  Neck:     Thyroid: No thyroid mass.  Vascular: No carotid bruit.  Cardiovascular:     Rate and Rhythm: Normal rate and regular rhythm.     Pulses: Normal pulses.     Heart sounds: Normal heart sounds. No murmur heard.   Pulmonary:     Effort: Pulmonary effort is normal.     Breath sounds: Normal breath sounds.  Abdominal:     General: Abdomen is flat. Bowel sounds are normal. There is no distension.     Palpations: Abdomen is soft.     Tenderness: There is no abdominal tenderness.  Genitourinary:    Rectum: Guaiac result negative.  Musculoskeletal:        General: No swelling. Normal range of motion.     Cervical  back: Full passive range of motion without pain, normal range of motion and neck supple.     Right lower leg: No edema.     Left lower leg: No edema.  Skin:    General: Skin is warm and dry.     Capillary Refill: Capillary refill takes less than 2 seconds.  Neurological:     General: No focal deficit present.     Mental Status: She is alert and oriented to person, place, and time.     Cranial Nerves: No cranial nerve deficit.     Sensory: No sensory deficit.  Psychiatric:        Mood and Affect: Mood normal.        Behavior: Behavior normal.        Thought Content: Thought content normal.        Judgment: Judgment normal.         Assessment And Plan:     1. Annual physical exam . Behavior modifications discussed and diet history reviewed.   . Pt will continue to exercise regularly and modify diet with low GI, plant based foods and decrease intake of processed foods.  . Recommend intake of daily multivitamin, Vitamin D, and calcium.  . Recommend mammogram and colonoscopy for preventive screenings, as well as recommend immunizations that include influenza, TDAP (up to date) - CMP14+EGFR - CBC - POCT Urinalysis Dipstick (81002) - POCT UA - Microalbumin  2. Vitamin D deficiency  Will check vitamin D level and supplement as needed.     Also encouraged to spend 15 minutes in the sun daily.  - VITAMIN D 25 Hydroxy (Vit-D Deficiency, Fractures)  3. Prediabetes  Chronic, stable, no current medications  Diet and exercise controlled  Encouraged to limit intake of sugary foods and drinks  Encouraged to increase physical activity to 150 minutes per week - Hemoglobin A1c  4. Encounter for hepatitis C screening test for low risk patient  Will check Hepatitis C screening due to recent recommendations to screen all adults 18 years and older - Hepatitis C antibody  5. Elevated cholesterol  Chronic, she is not on any medication  Encouraged to avoid fried and fatty foods,  increase physical activity - Lipid panel  6. Family history of colon cancer - Ambulatory referral to Gastroenterology    Patient was given opportunity to ask questions. Patient verbalized understanding of the plan and was able to repeat key elements of the plan. All questions were answered to their satisfaction.    Teola Bradley, FNP, have reviewed all documentation for this visit. The documentation on 11/05/19 for the exam, diagnosis, procedures, and orders are all accurate and complete.   THE PATIENT IS ENCOURAGED TO PRACTICE SOCIAL DISTANCING DUE TO THE COVID-19 PANDEMIC.

## 2019-10-22 NOTE — Progress Notes (Deleted)
  This visit occurred during the SARS-CoV-2 public health emergency.  Safety protocols were in place, including screening questions prior to the visit, additional usage of staff PPE, and extensive cleaning of exam room while observing appropriate contact time as indicated for disinfecting solutions.  Subjective:     Patient ID: Sarah Shaw , female    DOB: December 13, 1975 , 44 y.o.   MRN: [id number]   Chief Complaint  Patient presents with  . Annual Exam    HPI  Patient here for HM.  Wt Readings from Last 3 Encounters: 10/22/19 : 227 lb 6.4 oz (103.1 kg) 06/30/19 : 226 lb 3.2 oz (102.6 kg) 01/27/19 : 216 lb 12.8 oz (98.3 kg)    History reviewed. No pertinent past medical history.   Family History  Problem Relation Age of Onset  . Healthy Mother   . Cancer Father     No current outpatient medications on file.   No Known Allergies   Review of Systems   Today's Vitals   10/22/19 0844  BP: 114/70  Pulse: 88  Temp: 97.9 F (36.6 C)  TempSrc: Oral  Weight: 227 lb 6.4 oz (103.1 kg)  Height: 5' 5.8" (1.671 m)  PainSc: 0-No pain   Body mass index is 36.93 kg/m.   Objective:  Physical Exam      Assessment And Plan:     1. Annual physical exam     Patient was given opportunity to ask questions. Patient verbalized understanding of the plan and was able to repeat key elements of the plan. All questions were answered to their satisfaction.  [address] [place], CMA   I, [place], [place], have reviewed all documentation for this visit. The documentation on 10/22/19 for the exam, diagnosis, procedures, and orders are all accurate and complete.  THE PATIENT IS ENCOURAGED TO PRACTICE SOCIAL DISTANCING DUE TO THE COVID-19 PANDEMIC.

## 2019-10-23 LAB — CMP14+EGFR
ALT: 22 IU/L (ref 0–32)
AST: 18 IU/L (ref 0–40)
Albumin/Globulin Ratio: 1.6 (ref 1.2–2.2)
Albumin: 4.2 g/dL (ref 3.8–4.8)
Alkaline Phosphatase: 58 IU/L (ref 44–121)
BUN/Creatinine Ratio: 12 (ref 9–23)
BUN: 10 mg/dL (ref 6–24)
Bilirubin Total: 0.3 mg/dL (ref 0.0–1.2)
CO2: 22 mmol/L (ref 20–29)
Calcium: 9.2 mg/dL (ref 8.7–10.2)
Chloride: 107 mmol/L — ABNORMAL HIGH (ref 96–106)
Creatinine, Ser: 0.86 mg/dL (ref 0.57–1.00)
GFR calc Af Amer: 95 mL/min/{1.73_m2} (ref >59)
GFR calc non Af Amer: 82 mL/min/{1.73_m2} (ref >59)
Globulin, Total: 2.7 g/dL (ref 1.5–4.5)
Glucose: 95 mg/dL (ref 65–99)
Potassium: 4.2 mmol/L (ref 3.5–5.2)
Sodium: 139 mmol/L (ref 134–144)
Total Protein: 6.9 g/dL (ref 6.0–8.5)

## 2019-10-23 LAB — CBC
Hematocrit: 34 % (ref 34.0–46.6)
Hemoglobin: 10.3 g/dL — ABNORMAL LOW (ref 11.1–15.9)
MCH: 22 pg — ABNORMAL LOW (ref 26.6–33.0)
MCHC: 30.3 g/dL — ABNORMAL LOW (ref 31.5–35.7)
MCV: 73 fL — ABNORMAL LOW (ref 79–97)
Platelets: 315 10*3/uL (ref 150–450)
RBC: 4.68 x10E6/uL (ref 3.77–5.28)
RDW: 16 % — ABNORMAL HIGH (ref 11.7–15.4)
WBC: 7.2 10*3/uL (ref 3.4–10.8)

## 2019-10-23 LAB — LIPID PANEL
Chol/HDL Ratio: 4.1 ratio (ref 0.0–4.4)
Cholesterol, Total: 180 mg/dL (ref 100–199)
HDL: 44 mg/dL (ref >39)
LDL Chol Calc (NIH): 117 mg/dL — ABNORMAL HIGH (ref 0–99)
Triglycerides: 103 mg/dL (ref 0–149)
VLDL Cholesterol Cal: 19 mg/dL (ref 5–40)

## 2019-10-23 LAB — VITAMIN D 25 HYDROXY (VIT D DEFICIENCY, FRACTURES): Vit D, 25-Hydroxy: 12 ng/mL — ABNORMAL LOW (ref 30.0–100.0)

## 2019-10-23 LAB — HEMOGLOBIN A1C
Est. average glucose Bld gHb Est-mCnc: 117 mg/dL
Hgb A1c MFr Bld: 5.7 % — ABNORMAL HIGH (ref 4.8–5.6)

## 2019-10-23 LAB — HEPATITIS C ANTIBODY: Hep C Virus Ab: <0.1 s/co ratio (ref 0.0–0.9)

## 2019-11-12 ENCOUNTER — Other Ambulatory Visit: Payer: Self-pay | Admitting: Nurse Practitioner

## 2019-11-12 MED ORDER — VITAMIN D (ERGOCALCIFEROL) 1.25 MG (50000 UNIT) PO CAPS: 50000.0000 [IU] | ORAL_CAPSULE | ORAL | 0 refills | Status: DC

## 2019-11-12 MED ORDER — FERROUS SULFATE 325 (65 FE) MG PO TBEC: 325.0000 mg | DELAYED_RELEASE_TABLET | Freq: Every day | ORAL | 0 refills | Status: DC

## 2019-11-12 NOTE — Progress Notes (Signed)
I sent an iron and vitamin d supplement to the pharmacy

## 2019-11-25 DIAGNOSIS — Z1211 Encounter for screening for malignant neoplasm of colon: Secondary | ICD-10-CM | POA: Diagnosis not present

## 2019-11-25 DIAGNOSIS — D509 Iron deficiency anemia, unspecified: Secondary | ICD-10-CM | POA: Diagnosis not present

## 2019-11-25 DIAGNOSIS — K449 Diaphragmatic hernia without obstruction or gangrene: Secondary | ICD-10-CM | POA: Diagnosis not present

## 2019-11-25 DIAGNOSIS — K219 Gastro-esophageal reflux disease without esophagitis: Secondary | ICD-10-CM | POA: Diagnosis not present

## 2019-11-25 DIAGNOSIS — Z800 Family history of malignant neoplasm of digestive organs: Secondary | ICD-10-CM | POA: Diagnosis not present

## 2019-12-11 DIAGNOSIS — L2084 Intrinsic (allergic) eczema: Secondary | ICD-10-CM | POA: Diagnosis not present

## 2020-01-25 DIAGNOSIS — E049 Nontoxic goiter, unspecified: Secondary | ICD-10-CM | POA: Insufficient documentation

## 2020-01-26 DIAGNOSIS — K2100 Gastro-esophageal reflux disease with esophagitis, without bleeding: Secondary | ICD-10-CM | POA: Diagnosis not present

## 2020-01-26 DIAGNOSIS — Z800 Family history of malignant neoplasm of digestive organs: Secondary | ICD-10-CM | POA: Diagnosis not present

## 2020-01-26 DIAGNOSIS — Z1211 Encounter for screening for malignant neoplasm of colon: Secondary | ICD-10-CM | POA: Diagnosis not present

## 2020-01-26 DIAGNOSIS — K2270 Barrett's esophagus without dysplasia: Secondary | ICD-10-CM | POA: Diagnosis not present

## 2020-01-26 DIAGNOSIS — D509 Iron deficiency anemia, unspecified: Secondary | ICD-10-CM | POA: Diagnosis not present

## 2020-01-26 LAB — HM COLONOSCOPY

## 2020-01-27 ENCOUNTER — Encounter: Payer: Self-pay | Admitting: Nurse Practitioner

## 2020-01-27 DIAGNOSIS — D649 Anemia, unspecified: Secondary | ICD-10-CM | POA: Diagnosis not present

## 2020-01-27 DIAGNOSIS — E559 Vitamin D deficiency, unspecified: Secondary | ICD-10-CM | POA: Diagnosis not present

## 2020-01-27 DIAGNOSIS — Z6834 Body mass index (BMI) 34.0-34.9, adult: Secondary | ICD-10-CM | POA: Diagnosis not present

## 2020-01-27 DIAGNOSIS — Z1239 Encounter for other screening for malignant neoplasm of breast: Secondary | ICD-10-CM | POA: Diagnosis not present

## 2020-01-27 DIAGNOSIS — Z800 Family history of malignant neoplasm of digestive organs: Secondary | ICD-10-CM | POA: Diagnosis not present

## 2020-01-27 DIAGNOSIS — Z01419 Encounter for gynecological examination (general) (routine) without abnormal findings: Secondary | ICD-10-CM | POA: Diagnosis not present

## 2020-01-27 LAB — HM MAMMOGRAPHY

## 2020-03-02 ENCOUNTER — Other Ambulatory Visit (HOSPITAL_COMMUNITY): Payer: Self-pay | Admitting: Gastroenterology

## 2020-03-02 DIAGNOSIS — D509 Iron deficiency anemia, unspecified: Secondary | ICD-10-CM | POA: Diagnosis not present

## 2020-03-02 DIAGNOSIS — K449 Diaphragmatic hernia without obstruction or gangrene: Secondary | ICD-10-CM | POA: Diagnosis not present

## 2020-03-02 DIAGNOSIS — Z800 Family history of malignant neoplasm of digestive organs: Secondary | ICD-10-CM | POA: Diagnosis not present

## 2020-03-02 DIAGNOSIS — K2100 Gastro-esophageal reflux disease with esophagitis, without bleeding: Secondary | ICD-10-CM | POA: Diagnosis not present

## 2020-03-02 LAB — CBC AND DIFFERENTIAL
HCT: 33 — AB (ref 36–46)
Hemoglobin: 9.8 — AB (ref 12.0–16.0)
Platelets: 354 (ref 150–399)
WBC: 7.7

## 2020-03-02 LAB — IRON,TIBC AND FERRITIN PANEL
Ferritin: 5
Iron: 26

## 2020-03-02 LAB — CBC: RBC: 4.67 (ref 3.87–5.11)

## 2020-03-02 MED FILL — PANTOPRAZOLE SOD DR 40 MG T: 40 | 90 days supply | Qty: 90 | Fill #0

## 2020-03-03 ENCOUNTER — Encounter: Payer: Self-pay | Admitting: Nurse Practitioner

## 2020-03-04 ENCOUNTER — Encounter: Payer: Self-pay | Admitting: Nurse Practitioner

## 2020-03-04 ENCOUNTER — Other Ambulatory Visit: Payer: Self-pay

## 2020-03-16 ENCOUNTER — Ambulatory Visit: Payer: 59 | Attending: Internal Medicine

## 2020-03-16 DIAGNOSIS — Z23 Encounter for immunization: Secondary | ICD-10-CM

## 2020-03-16 NOTE — Progress Notes (Signed)
   Covid-19 Vaccination Clinic  Name:  Sarah Shaw    MRN: [id number] DOB: [birth year]  03/16/2020  [name] was observed post Covid-19 immunization for 15 minutes without incident. She was provided with Vaccine Information Sheet and instruction to access the V-Safe system.   [name] was instructed to call 911 with any severe reactions post vaccine: Marland Kitchen Difficulty breathing  . Swelling of face and throat  . A fast heartbeat  . A bad rash all over body  . Dizziness and weakness   Immunizations Administered    Name Date Dose VIS Date Route   PFIZER Comrnaty(Gray TOP) Covid-19 Vaccine 03/16/2020  1:12 PM 0.3 mL 01/15/2020 Intramuscular   Manufacturer: [place]   Lot: [id number]   NDC: [phone]

## 2020-03-28 IMAGING — US US THYROID
1 series · 13 of 25 positions shown · non-contrast
Comparison: 08/30/2016

CLINICAL DATA: Goiter.

EXAM:
THYROID ULTRASOUND
TECHNIQUE: Ultrasound examination of the thyroid gland and adjacent soft
tissues was performed.

[Series 1: us thyroid · 0.04mm/px · 13 of 63 slices shown]
[im 1/63]
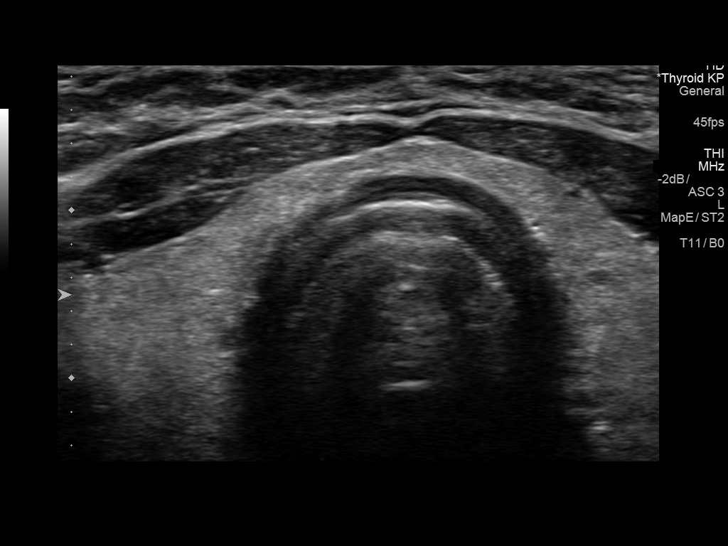
[im 6/63]
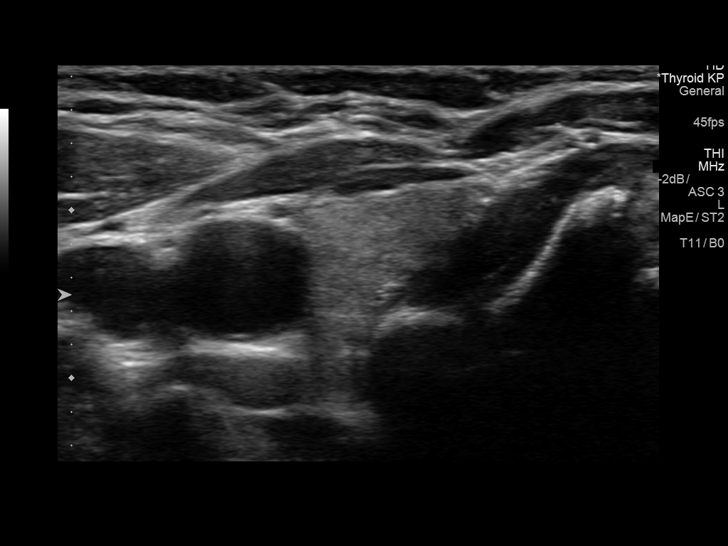
[im 11/63]
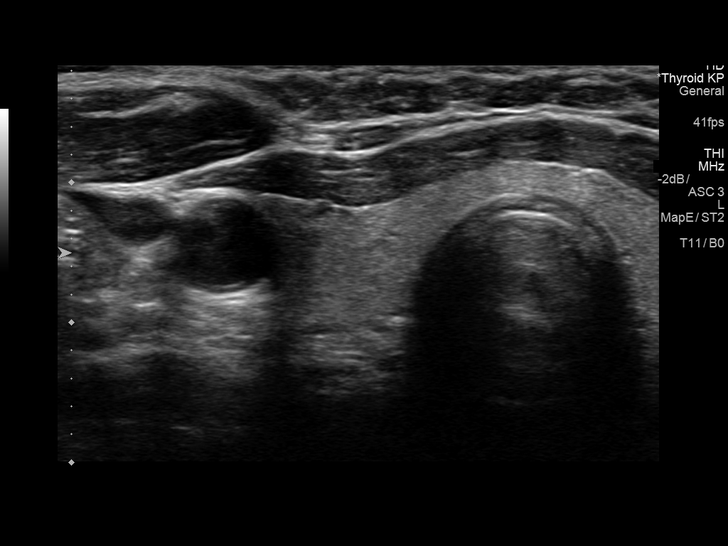
[im 16/63]
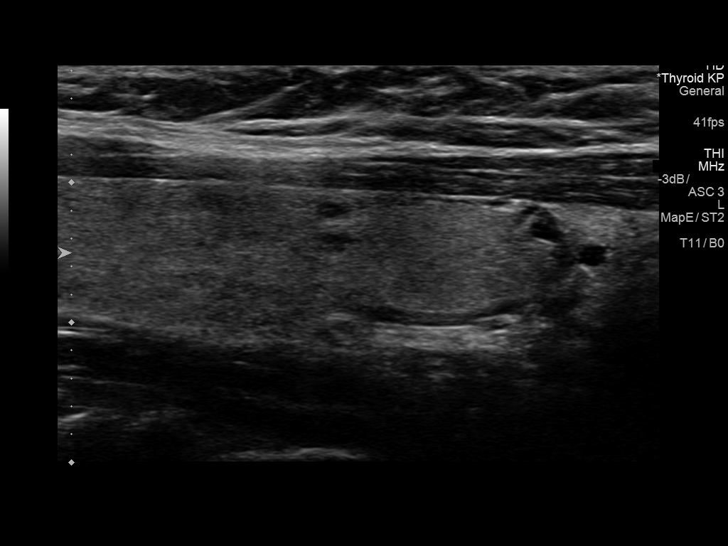
[im 21/63]
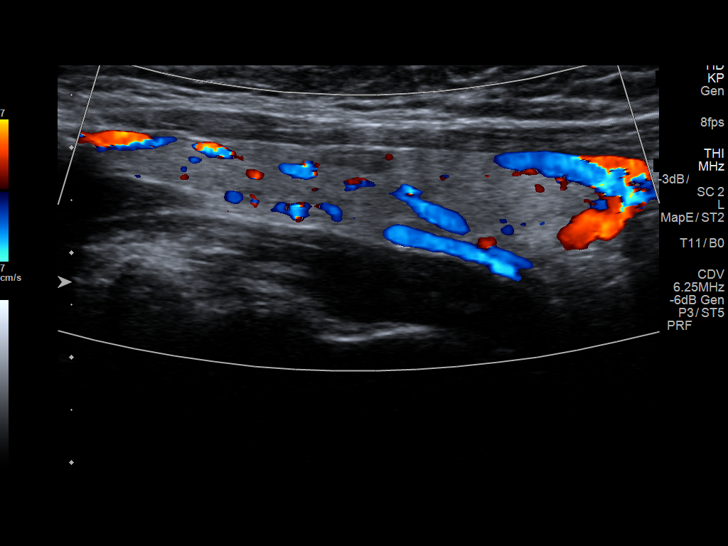
[im 26/63]
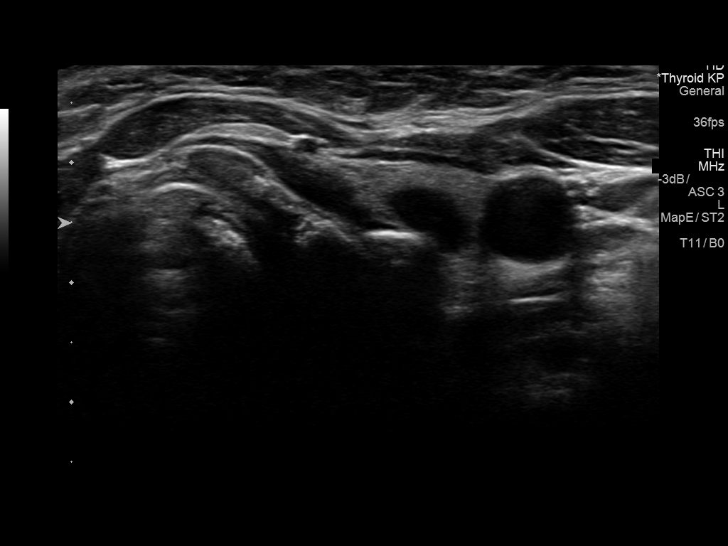
[im 32/63]
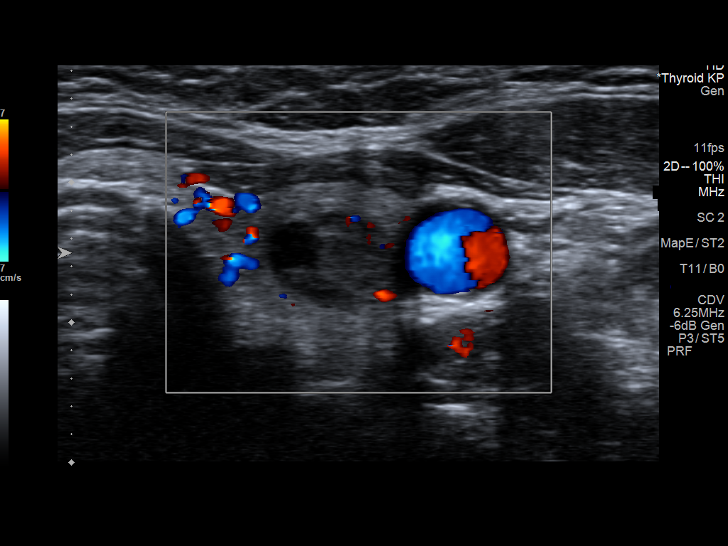
[im 37/63]
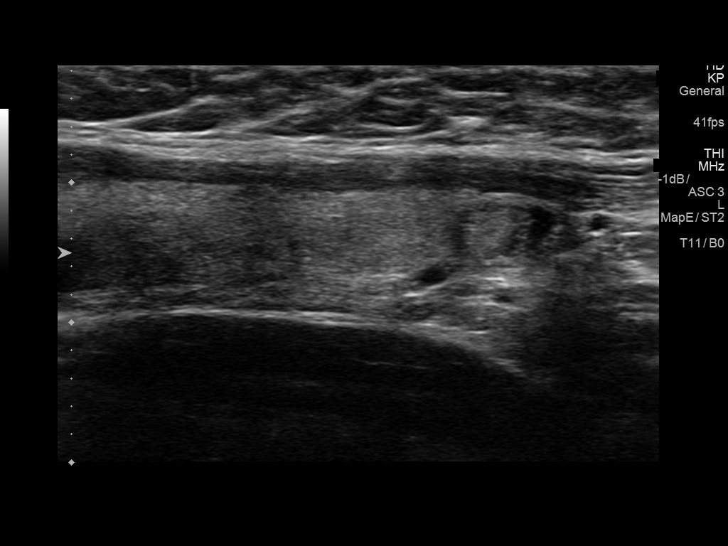
[im 42/63]
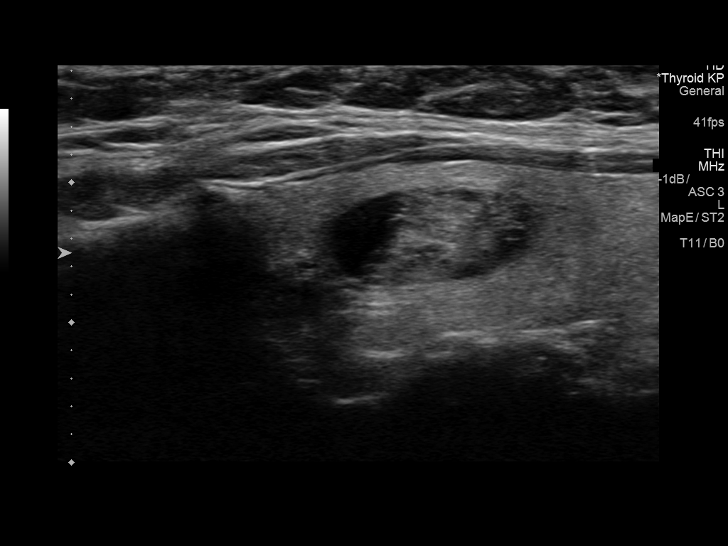
[im 47/63]
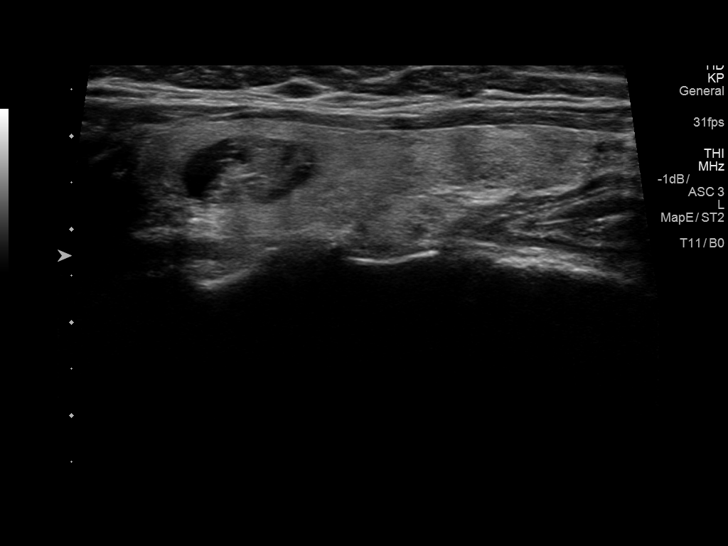
[im 52/63]
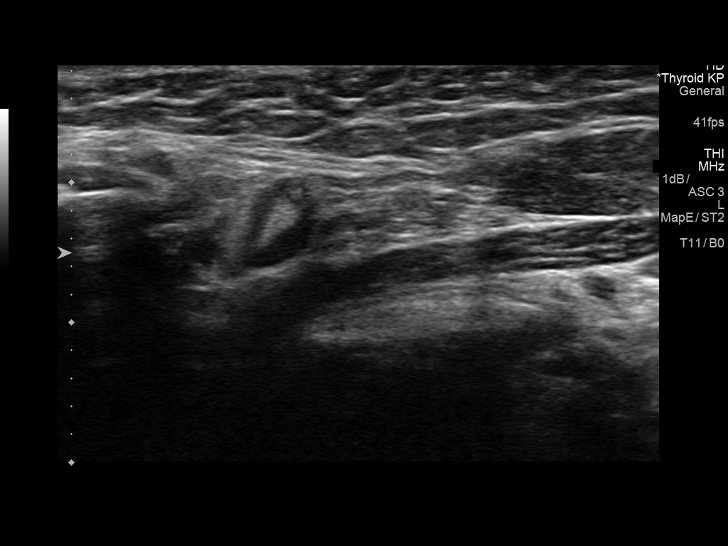
[im 57/63]
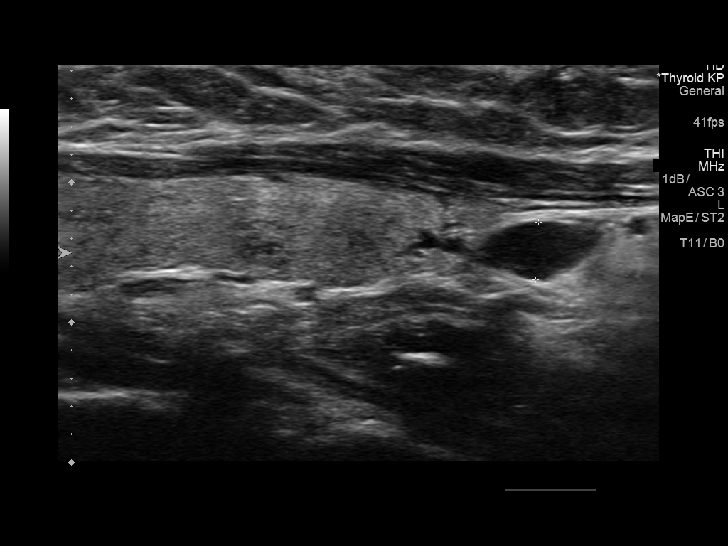
[im 63/63]
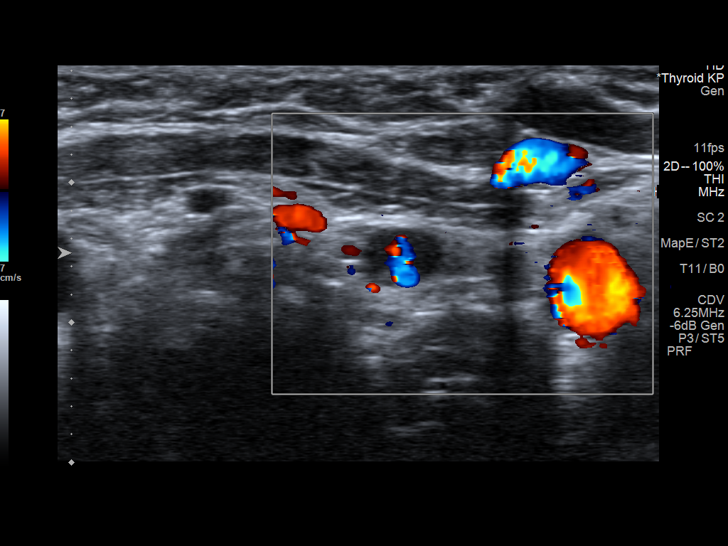

[13 of 25 positions shown; findings below may reference images not displayed]

FINDINGS: Parenchymal Echotexture: Mildly heterogenous

Isthmus: 0.3 cm, previously 0.3 cm

Right lobe: 5.7 x 1.5 x 1.6 cm, previously 5.3 x 1.6 x 0.9 cm

Left lobe: 5.3 x 1.2 x 1.5 cm, previously 5.1 x 1.4 x 1.5 cm

_________________________________________________________

Estimated total number of nodules >/= 1 cm: 1

Number of spongiform nodules >/=  2 cm not described below (TR1): 0

Number of mixed cystic and solid nodules >/= 1.5 cm not described
below (TR2): 0

_________________________________________________________

Nodule # 1:

Prior biopsy: No

Location: Left; Superior

Maximum size: 1.5 cm; Other 2 dimensions: 1.1 x 0.8 cm, previously,
1.3 x 0.9 x 0.7 cm

Composition: mixed cystic and solid (1)

Echogenicity: isoechoic (1)

Shape: not taller-than-wide (0)

Margins: smooth (0)

Echogenic foci: none (0)

ACR TI-RADS total points: 2.

ACR TI-RADS risk category:  TR2 (2 points).

Significant change in size (>/= 20% in two dimensions and minimal
increase of 2 mm): No

Change in features: Yes

Change in ACR TI-RADS risk category: Yes

ACR TI-RADS recommendations:

This nodule does NOT meet TI-RADS criteria for biopsy or dedicated
follow-up.

_________________________________________________________

Other nodules measure 0.8 cm or less in size and do not meet
criteria for biopsy nor follow-up.
IMPRESSION: There are no nodules which meet criteria for biopsy nor follow-up

Previously, left nodule 1 was a TR3 lesion. Today, it is cystic and
solid and is a TR2 lesion (less risk). It does not meet criteria for
biopsy nor follow-up.

The above is in keeping with the ACR TI-RADS recommendations - [HOSPITAL] 9222;[DATE].

## 2020-04-07 ENCOUNTER — Encounter: Payer: Self-pay | Admitting: Nurse Practitioner

## 2020-04-08 ENCOUNTER — Other Ambulatory Visit: Payer: Self-pay | Admitting: Nurse Practitioner

## 2020-04-20 ENCOUNTER — Other Ambulatory Visit: Payer: Self-pay

## 2020-04-20 ENCOUNTER — Encounter: Payer: Self-pay | Admitting: Nurse Practitioner

## 2020-04-20 ENCOUNTER — Ambulatory Visit: Payer: 59 | Admitting: Nurse Practitioner

## 2020-04-20 VITALS — BP 124/80 | HR 75 | Temp 97.5°F | Ht 66.0 in | Wt 225.4 lb

## 2020-04-20 DIAGNOSIS — Z6836 Body mass index (BMI) 36.0-36.9, adult: Secondary | ICD-10-CM | POA: Diagnosis not present

## 2020-04-20 DIAGNOSIS — E611 Iron deficiency: Secondary | ICD-10-CM

## 2020-04-20 DIAGNOSIS — R7303 Prediabetes: Secondary | ICD-10-CM

## 2020-04-20 DIAGNOSIS — E559 Vitamin D deficiency, unspecified: Secondary | ICD-10-CM | POA: Diagnosis not present

## 2020-04-20 DIAGNOSIS — E669 Obesity, unspecified: Secondary | ICD-10-CM

## 2020-04-20 NOTE — Progress Notes (Addendum)
I,Yamilka Roman [employer] as a Education administrator for [employer], FNP.,have documented all relevant documentation on the behalf of Minette Brine, FNP,as directed by  Minette Brine, FNP while in the presence of Minette Brine, [place]. This visit occurred during the SARS-CoV-2 public health emergency.  Safety protocols were in place, including screening questions prior to the visit, additional usage of staff PPE, and extensive cleaning of exam room while observing appropriate contact time as indicated for disinfecting solutions.  Subjective:     Patient ID: Sarah Shaw , female    DOB: December 22, 1975 , 45 y.o.   MRN: [id number]   Chief Complaint  Patient presents with  . vitamin d recheck  . Prediabetes    HPI  Patient presents today for a vitamin d and prediabetes f/u.  She has been exercising about 3 times a week. She is taking an iron supplement once a day, was on 3 times a day before her colonoscopy. Denies fatigue or a history of fibroids.   Wt Readings from Last 3 Encounters: 04/20/20 : 225 lb 6.4 oz (102.2 kg) 10/22/19 : 227 lb 6.4 oz (103.1 kg) 06/30/19 : 226 lb 3.2 oz (102.6 kg)    History reviewed. No pertinent past medical history.   Family History  Problem Relation Age of Onset  . Healthy Mother   . Cancer Father      Current Outpatient Medications:  .  ferrous sulfate 325 (65 FE) MG EC tablet, Take 1 tablet (325 mg total) by mouth daily with breakfast., Disp: 90 tablet, Rfl: 0 .  Vitamin D, Ergocalciferol, (DRISDOL) 1.25 MG (50000 UNIT) CAPS capsule, TAKE 1 CAPSULE (50,000 UNITS TOTAL) BY MOUTH EVERY 7 (SEVEN) DAYS, Disp: 12 capsule, Rfl: 0   No Known Allergies   Review of Systems  Constitutional: Negative.   HENT: Negative.   Eyes: Negative.   Respiratory: Negative.   Cardiovascular: Negative.   Gastrointestinal: Negative.   Endocrine: Negative.   Genitourinary: Negative.   Musculoskeletal: Negative.   Skin: Negative.   Neurological: Negative.   Hematological:  Negative.   Psychiatric/Behavioral: Negative.      Today's Vitals   04/20/20 0905  BP: 124/80  Pulse: 75  Temp: (!) 97.5 F (36.4 C)  TempSrc: Oral  Weight: 225 lb 6.4 oz (102.2 kg)  Height: 5\' 6"  (1.676 m)  PainSc: 0-No pain   Body mass index is 36.38 kg/m.   Objective:  Physical Exam Constitutional:      General: She is not in acute distress.    Appearance: Normal appearance. She is obese.  Cardiovascular:     Rate and Rhythm: Normal rate and regular rhythm.     Pulses: Normal pulses.     Heart sounds: Normal heart sounds. No murmur heard.   Pulmonary:     Effort: Pulmonary effort is normal. No respiratory distress.     Breath sounds: Normal breath sounds. No wheezing.  Musculoskeletal:     Cervical back: Normal range of motion and neck supple.  Skin:    General: Skin is warm and dry.     Capillary Refill: Capillary refill takes less than 2 seconds.  Neurological:     General: No focal deficit present.     Mental Status: She is alert and oriented to person, place, and time.     Cranial Nerves: No cranial nerve deficit.     Motor: No weakness.  Psychiatric:        Mood and Affect: Mood normal.  Behavior: Behavior normal.        Thought Content: Thought content normal.        Judgment: Judgment normal.         Assessment And Plan:     1. Vitamin D deficiency  Will check vitamin D level and supplement as needed.     Also encouraged to spend 15 minutes in the sun daily.  - Vitamin D (25 hydroxy)  2. Prediabetes  Chronic, improved slightly at last visit  no current medications  Encouraged to limit intake of sugary foods and drinks  Encouraged to increase physical activity to 150 minutes per week - Hemoglobin A1c  3. Class 2 obesity without serious comorbidity with body mass index (BMI) of 36.0 to 36.9 in adult, unspecified obesity type Chronic, she has lost 2 lbs since her last office visit Discussed healthy diet and regular exercise options   Encouraged to exercise at least 150 minutes per week with 2 days of strength training  4. Low iron  Hgb had dropped to 9.8 when she has her colonoscopy and had been taking iron supplement 3 times a day, she is now on 1 supplement daily   Will recheck levels. - Iron, TIBC and Ferritin Panel - CBC     Patient was given opportunity to ask questions. Patient verbalized understanding of the plan and was able to repeat key elements of the plan. All questions were answered to their satisfaction.  Minette Brine, FNP   I, Minette Brine, FNP, have reviewed all documentation for this visit. The documentation on 04/20/20 for the exam, diagnosis, procedures, and orders are all accurate and complete.   IF YOU HAVE BEEN REFERRED TO A SPECIALIST, IT MAY TAKE 1-2 WEEKS TO SCHEDULE/PROCESS THE REFERRAL. IF YOU HAVE NOT HEARD FROM US/SPECIALIST IN TWO WEEKS, PLEASE GIVE [place] A CALL AT [phone] X [phone].   THE PATIENT IS ENCOURAGED TO PRACTICE SOCIAL DISTANCING DUE TO THE COVID-19 PANDEMIC.

## 2020-04-20 NOTE — Patient Instructions (Signed)
Vitamin D Deficiency Vitamin D deficiency is when your body does not have enough vitamin D. Vitamin D is important to your body because:  It helps your body use other minerals.  It helps to keep your bones strong and healthy.  It may help to prevent some diseases.  It helps your heart and other muscles work well. Not getting enough vitamin D can make your bones soft. It can also cause other health problems. What are the causes? This condition may be caused by:  Not eating enough foods that contain vitamin D.  Not getting enough sun.  Having diseases that make it hard for your body to absorb vitamin D.  Having a surgery in which a part of the stomach or a part of the small intestine is removed.  Having kidney disease or liver disease. What increases the risk? You are more likely to get this condition if:  You are older.  You do not spend much time outdoors.  You live in a nursing home.  You have had broken bones.  You have weak or thin bones (osteoporosis).  You have a disease or condition that changes how your body absorbs vitamin D.  You have dark skin.  You take certain medicines.  You are overweight or obese. What are the signs or symptoms?  In mild cases, there may not be any symptoms. If the condition is very bad, symptoms may include: ? Bone pain. ? Muscle pain. ? Falling often. ? Broken bones caused by a minor injury. How is this treated? Treatment may include taking supplements as told by your doctor. Your doctor will tell you what dose is best for you. Supplements may include:  Vitamin D.  Calcium. Follow these instructions at home: Eating and drinking  Eat foods that contain vitamin D, such as: ? Dairy products, cereals, or juices with added vitamin D. Check the label. ? Fish, such as salmon or trout. ? Eggs. ? Oysters. ? Mushrooms. The items listed above may not be a complete list of what you can eat and drink. Contact a dietitian for more  options.   General instructions  Take medicines and supplements only as told by your doctor.  Get regular, safe exposure to natural sunlight.  Do not use a tanning bed.  Maintain a healthy weight. Lose weight if needed.  Keep all follow-up visits as told by your doctor. This is important. How is this prevented?  You can get vitamin D by: ? Eating foods that naturally contain vitamin D. ? Eating or drinking products that have vitamin D added to them, such as cereals, juices, and milk. ? Taking vitamin D or a multivitamin that contains vitamin D. ? Being in the sun. Your body makes vitamin D when your skin is exposed to sunlight. Your body changes the sunlight into a form of the vitamin that it can use. Contact a doctor if:  Your symptoms do not go away.  You feel sick to your stomach (nauseous).  You throw up (vomit).  You poop less often than normal, or you have trouble pooping (constipation). Summary  Vitamin D deficiency is when your body does not have enough vitamin D.  Vitamin D helps to keep your bones strong and healthy.  This condition is often treated by taking a supplement.  Your doctor will tell you what dose is best for you. This information is not intended to replace advice given to you by your health care provider. Make sure you discuss any questions   you have with your health care provider. Document Revised: 10/01/2017 Document Reviewed: 10/01/2017 Elsevier Patient Education  2021 Elsevier Inc.  

## 2020-04-21 LAB — HEMOGLOBIN A1C
Est. average glucose Bld gHb Est-mCnc: 120 mg/dL
Hgb A1c MFr Bld: 5.8 % — ABNORMAL HIGH (ref 4.8–5.6)

## 2020-04-21 LAB — IRON,TIBC AND FERRITIN PANEL
Ferritin: 11 ng/mL — ABNORMAL LOW (ref 15–150)
Iron Saturation: 7 % — CL (ref 15–55)
Iron: 30 ug/dL (ref 27–159)
Total Iron Binding Capacity: 430 ug/dL (ref 250–450)
UIBC: 400 ug/dL (ref 131–425)

## 2020-04-21 LAB — CBC
Hematocrit: 32.8 % — ABNORMAL LOW (ref 34.0–46.6)
Hemoglobin: 10.4 g/dL — ABNORMAL LOW (ref 11.1–15.9)
MCH: 22.4 pg — ABNORMAL LOW (ref 26.6–33.0)
MCHC: 31.7 g/dL (ref 31.5–35.7)
MCV: 71 fL — ABNORMAL LOW (ref 79–97)
Platelets: 368 10*3/uL (ref 150–450)
RBC: 4.65 x10E6/uL (ref 3.77–5.28)
RDW: 17 % — ABNORMAL HIGH (ref 11.7–15.4)
WBC: 7.3 10*3/uL (ref 3.4–10.8)

## 2020-04-21 LAB — VITAMIN D 25 HYDROXY (VIT D DEFICIENCY, FRACTURES): Vit D, 25-Hydroxy: 37.9 ng/mL (ref 30.0–100.0)

## 2020-04-29 DIAGNOSIS — Z1231 Encounter for screening mammogram for malignant neoplasm of breast: Secondary | ICD-10-CM | POA: Diagnosis not present

## 2020-06-29 ENCOUNTER — Other Ambulatory Visit: Payer: Self-pay | Admitting: Nurse Practitioner

## 2020-07-22 ENCOUNTER — Ambulatory Visit: Payer: Self-pay | Admitting: Nurse Practitioner

## 2020-08-11 ENCOUNTER — Encounter: Payer: Self-pay | Admitting: Nurse Practitioner

## 2020-08-11 ENCOUNTER — Ambulatory Visit: Payer: 59 | Admitting: Nurse Practitioner

## 2020-08-11 ENCOUNTER — Other Ambulatory Visit: Payer: Self-pay

## 2020-08-11 VITALS — BP 118/80 | HR 75 | Temp 98.1°F | Ht 66.0 in | Wt 212.8 lb

## 2020-08-11 DIAGNOSIS — R7309 Other abnormal glucose: Secondary | ICD-10-CM | POA: Diagnosis not present

## 2020-08-11 DIAGNOSIS — Z6834 Body mass index (BMI) 34.0-34.9, adult: Secondary | ICD-10-CM

## 2020-08-11 DIAGNOSIS — E6609 Other obesity due to excess calories: Secondary | ICD-10-CM

## 2020-08-11 DIAGNOSIS — E559 Vitamin D deficiency, unspecified: Secondary | ICD-10-CM

## 2020-08-11 DIAGNOSIS — E611 Iron deficiency: Secondary | ICD-10-CM | POA: Diagnosis not present

## 2020-08-11 NOTE — Progress Notes (Signed)
I,Yamilka Roman [employer] as a Education administrator for [employer], FNP.,have documented all relevant documentation on the behalf of Minette Brine, FNP,as directed by  Minette Brine, FNP while in the presence of Minette Brine, [place].  This visit occurred during the SARS-CoV-2 public health emergency.  Safety protocols were in place, including screening questions prior to the visit, additional usage of staff PPE, and extensive cleaning of exam room while observing appropriate contact time as indicated for disinfecting solutions.  Subjective:     Patient ID: Sarah Shaw , female    DOB: Aug 12, 1975 , 45 y.o.   MRN: [id number]   Chief Complaint  Patient presents with   low iron f/u    HPI  Patient presents today for low iron f/u.  She is taking iron supplement every other day She sees Earnstine Regal for her GYN. She has heavy cycles for 2 days with clots.  Denies shortness of breath or pica, has some fatigue  Wt Readings from Last 3 Encounters: 08/11/20 : 212 lb 12.8 oz (96.5 kg) 04/20/20 : 225 lb 6.4 oz (102.2 kg) 10/22/19 : 227 lb 6.4 oz (103.1 kg)      History reviewed. No pertinent past medical history.   Family History  Problem Relation Age of Onset   Healthy Mother    Cancer Father      Current Outpatient Medications:    ferrous sulfate 325 (65 FE) MG EC tablet, Take 1 tablet (325 mg total) by mouth daily with breakfast., Disp: 90 tablet, Rfl: 0   Vitamin D, Ergocalciferol, (DRISDOL) 1.25 MG (50000 UNIT) CAPS capsule, TAKE 1 CAPSULE (50,000 UNITS TOTAL) BY MOUTH EVERY 7 (SEVEN) DAYS, Disp: 12 capsule, Rfl: 0   No Known Allergies   Review of Systems  Constitutional: Negative.   Eyes: Negative.   Respiratory: Negative.    Cardiovascular: Negative.   Gastrointestinal: Negative.   Musculoskeletal: Negative.   Skin: Negative.   Neurological:  Negative for dizziness and headaches.  Psychiatric/Behavioral: Negative.      Today's Vitals   08/11/20 1543  BP: 118/80  Pulse: 75   Temp: 98.1 F (36.7 C)  Weight: 212 lb 12.8 oz (96.5 kg)  Height: 5\' 6"  (1.676 m)  PainSc: 0-No pain   Body mass index is 34.35 kg/m.   Objective:  Physical Exam Vitals reviewed.  Constitutional:      General: She is not in acute distress.    Appearance: Normal appearance. She is obese.  Cardiovascular:     Rate and Rhythm: Normal rate and regular rhythm.     Pulses: Normal pulses.     Heart sounds: Normal heart sounds. No murmur heard. Pulmonary:     Effort: Pulmonary effort is normal. No respiratory distress.     Breath sounds: Normal breath sounds. No wheezing.  Musculoskeletal:     Cervical back: Normal range of motion and neck supple.  Skin:    General: Skin is warm and dry.     Capillary Refill: Capillary refill takes less than 2 seconds.  Neurological:     General: No focal deficit present.     Mental Status: She is alert and oriented to person, place, and time.     Cranial Nerves: No cranial nerve deficit.     Motor: No weakness.  Psychiatric:        Mood and Affect: Mood normal.        Behavior: Behavior normal.        Thought Content: Thought content normal.  Judgment: Judgment normal.        Assessment And Plan:     1. Low iron Comments: Will check iron studies No concerns about fatigue - Iron, TIBC and Ferritin Panel - CBC  2. Abnormal glucose Comments: Stable, continue with decreasing intake of sugar and carbs - Hemoglobin A1c  3. Vitamin D deficiency Will check vitamin D level and supplement as needed.    Also encouraged to spend 15 minutes in the sun daily.  - VITAMIN D 25 Hydroxy (Vit-D Deficiency, Fractures)  4. Class 1 obesity due to excess calories with body mass index (BMI) of 34.0 to 34.9 in adult, unspecified whether serious comorbidity present  Chronic Discussed healthy diet and regular exercise options  Continue exercising at least 150 minutes per week with 2 days of strength training   Patient was given opportunity to ask  questions. Patient verbalized understanding of the plan and was able to repeat key elements of the plan. All questions were answered to their satisfaction.  Minette Brine, FNP   I, Minette Brine, FNP, have reviewed all documentation for this visit. The documentation on 08/11/20 for the exam, diagnosis, procedures, and orders are all accurate and complete.   IF YOU HAVE BEEN REFERRED TO A SPECIALIST, IT MAY TAKE 1-2 WEEKS TO SCHEDULE/PROCESS THE REFERRAL. IF YOU HAVE NOT HEARD FROM US/SPECIALIST IN TWO WEEKS, PLEASE GIVE [place] A CALL AT [phone].   THE PATIENT IS ENCOURAGED TO PRACTICE SOCIAL DISTANCING DUE TO THE COVID-19 PANDEMIC.

## 2020-08-11 NOTE — Patient Instructions (Addendum)
Exercising to Lose Weight Exercise is structured, repetitive physical activity to improve fitness and health. Getting regular exercise is important for everyone. It is especially important if you are overweight. Being overweight increases your risk of heart disease, stroke, diabetes, high blood pressure, and several types of cancer.Reducing your calorie intake and exercising can help you lose weight. Exercise is usually categorized as moderate or vigorous intensity. To lose weight, most people need to do a certain amount of moderate-intensity orvigorous-intensity exercise each week. Moderate-intensity exercise  Moderate-intensity exercise is any activity that gets you moving enough to burn at least three times more energy (calories) than if you were sitting. Examples of moderate exercise include: Walking a mile in 15 minutes. Doing light yard work. Biking at an easy pace. Most people should get at least 150 minutes (2 hours and 30 minutes) a week ofmoderate-intensity exercise to maintain their body weight. Vigorous-intensity exercise Vigorous-intensity exercise is any activity that gets you moving enough to burn at least six times more calories than if you were sitting. When you exercise at this intensity, you should be working hard enough that you are not able tocarry on a conversation. Examples of vigorous exercise include: Running. Playing a team sport, such as football, basketball, and soccer. Jumping rope. Most people should get at least 75 minutes (1 hour and 15 minutes) a week ofvigorous-intensity exercise to maintain their body weight. How can exercise affect me? When you exercise enough to burn more calories than you eat, you lose weight. Exercise also reduces body fat and builds muscle. The more muscle you have, the more calories you burn. Exercise also: Improves mood. Reduces stress and tension. Improves your overall fitness, flexibility, and endurance. Increases bone strength. The  amount of exercise you need to lose weight depends on: Your age. The type of exercise. Any health conditions you have. Your overall physical ability. Talk to your health care provider about how much exercise you need and whattypes of activities are safe for you. What actions can I take to lose weight? Nutrition  Make changes to your diet as told by your health care provider or diet and nutrition specialist (dietitian). This may include: Eating fewer calories. Eating more protein. Eating less unhealthy fats. Eating a diet that includes fresh fruits and vegetables, whole grains, low-fat dairy products, and lean protein. Avoiding foods with added fat, salt, and sugar. Drink plenty of water while you exercise to prevent dehydration or heat stroke.  Activity Choose an activity that you enjoy and set realistic goals. Your health care provider can help you make an exercise plan that works for you. Exercise at a moderate or vigorous intensity most days of the week. The intensity of exercise may vary from person to person. You can tell how intense a workout is for you by paying attention to your breathing and heartbeat. Most people will notice their breathing and heartbeat get faster with more intense exercise. Do resistance training twice each week, such as: Push-ups. Sit-ups. Lifting weights. Using resistance bands. Getting short amounts of exercise can be just as helpful as long structured periods of exercise. If you have trouble finding time to exercise, try to include exercise in your daily routine. Get up, stretch, and walk around every 30 minutes throughout the day. Go for a walk during your lunch break. Park your car farther away from your destination. If you take public transportation, get off one stop early and walk the rest of the way. Make phone calls while standing up and   walking around. Take the stairs instead of elevators or escalators. Wear comfortable clothes and shoes with  good support. Do not exercise so much that you hurt yourself, feel dizzy, or get very short of breath. Where to find more information [place] Department of Health and Human Services: [employer]at Centers for Disease Control and Prevention (CDC): http://www.wolf.info/ Contact a health care provider: Before starting a new exercise program. If you have questions or concerns about your weight. If you have a medical problem that keeps you from exercising. Get help right away if you have any of the following while exercising: Injury. Dizziness. Difficulty breathing or shortness of breath that does not go away when you stop exercising. Chest pain. Rapid heartbeat. Summary Being overweight increases your risk of heart disease, stroke, diabetes, high blood pressure, and several types of cancer. Losing weight happens when you burn more calories than you eat. Reducing the amount of calories you eat in addition to getting regular moderate or vigorous exercise each week helps you lose weight. This information is not intended to replace advice given to you by your health care provider. Make sure you discuss any questions you have with your healthcare provider. Document Revised: 05/05/2019 Document Reviewed: 05/22/2019 Elsevier Patient Education  2022 [employer] ON YOUR 13 LB WEIGHT LOSS!!

## 2020-08-12 LAB — CBC
Hematocrit: 35.3 % (ref 34.0–46.6)
Hemoglobin: 11.4 g/dL (ref 11.1–15.9)
MCH: 26.1 pg — ABNORMAL LOW (ref 26.6–33.0)
MCHC: 32.3 g/dL (ref 31.5–35.7)
MCV: 81 fL (ref 79–97)
Platelets: 241 10*3/uL (ref 150–450)
RBC: 4.37 x10E6/uL (ref 3.77–5.28)
RDW: 14.5 % (ref 11.7–15.4)
WBC: 8.4 10*3/uL (ref 3.4–10.8)

## 2020-08-12 LAB — HEMOGLOBIN A1C
Est. average glucose Bld gHb Est-mCnc: 114 mg/dL
Hgb A1c MFr Bld: 5.6 % (ref 4.8–5.6)

## 2020-08-12 LAB — IRON,TIBC AND FERRITIN PANEL
Ferritin: 18 ng/mL (ref 15–150)
Iron Saturation: 15 % (ref 15–55)
Iron: 58 ug/dL (ref 27–159)
Total Iron Binding Capacity: 390 ug/dL (ref 250–450)
UIBC: 332 ug/dL (ref 131–425)

## 2020-08-12 LAB — VITAMIN D 25 HYDROXY (VIT D DEFICIENCY, FRACTURES): Vit D, 25-Hydroxy: 27.4 ng/mL — ABNORMAL LOW (ref 30.0–100.0)

## 2020-09-02 MED ORDER — VITAMIN D (ERGOCALCIFEROL) 1.25 MG (50000 UNIT) PO CAPS
50000.0000 [IU] | ORAL_CAPSULE | ORAL | 1 refills | Status: DC
  Filled 2020-09-02: qty 12, 84d supply, fill #0
  Filled 2021-04-13: qty 12, 84d supply, fill #1

## 2020-09-03 ENCOUNTER — Other Ambulatory Visit (HOSPITAL_COMMUNITY): Payer: Self-pay

## 2020-10-27 ENCOUNTER — Other Ambulatory Visit: Payer: Self-pay

## 2020-10-27 ENCOUNTER — Ambulatory Visit (INDEPENDENT_AMBULATORY_CARE_PROVIDER_SITE_OTHER): Payer: 59 | Admitting: Nurse Practitioner

## 2020-10-27 ENCOUNTER — Encounter: Payer: Self-pay | Admitting: Nurse Practitioner

## 2020-10-27 VITALS — BP 124/68 | HR 67 | Temp 98.1°F | Ht 66.2 in | Wt 207.2 lb

## 2020-10-27 DIAGNOSIS — H6122 Impacted cerumen, left ear: Secondary | ICD-10-CM | POA: Diagnosis not present

## 2020-10-27 DIAGNOSIS — Z0000 Encounter for general adult medical examination without abnormal findings: Secondary | ICD-10-CM | POA: Diagnosis not present

## 2020-10-27 DIAGNOSIS — R7309 Other abnormal glucose: Secondary | ICD-10-CM

## 2020-10-27 DIAGNOSIS — E559 Vitamin D deficiency, unspecified: Secondary | ICD-10-CM

## 2020-10-27 DIAGNOSIS — E7800 Pure hypercholesterolemia, unspecified: Secondary | ICD-10-CM | POA: Diagnosis not present

## 2020-10-27 NOTE — Patient Instructions (Signed)
Health Maintenance, Female Adopting a healthy lifestyle and getting preventive care are important in promoting health and wellness. Ask your health care provider about: The right schedule for you to have regular tests and exams. Things you can do on your own to prevent diseases and keep yourself healthy. What should I know about diet, weight, and exercise? Eat a healthy diet  Eat a diet that includes plenty of vegetables, fruits, low-fat dairy products, and lean protein. Do not eat a lot of foods that are high in solid fats, added sugars, or sodium. Maintain a healthy weight Body mass index (BMI) is used to identify weight problems. It estimates body fat based on height and weight. Your health care provider can help determine your BMI and help you achieve or maintain a healthy weight. Get regular exercise Get regular exercise. This is one of the most important things you can do for your health. Most adults should: Exercise for at least 150 minutes each week. The exercise should increase your heart rate and make you sweat (moderate-intensity exercise). Do strengthening exercises at least twice a week. This is in addition to the moderate-intensity exercise. Spend less time sitting. Even light physical activity can be beneficial. Watch cholesterol and blood lipids Have your blood tested for lipids and cholesterol at 46 years of age, then have this test every 5 years. Have your cholesterol levels checked more often if: Your lipid or cholesterol levels are high. You are older than 46 years of age. You are at high risk for heart disease. What should I know about cancer screening? Depending on your health history and family history, you may need to have cancer screening at various ages. This may include screening for: Breast cancer. Cervical cancer. Colorectal cancer. Skin cancer. Lung cancer. What should I know about heart disease, diabetes, and high blood pressure? Blood pressure and heart  disease High blood pressure causes heart disease and increases the risk of stroke. This is more likely to develop in people who have high blood pressure readings, are of African descent, or are overweight. Have your blood pressure checked: Every 3-5 years if you are 18-39 years of age. Every year if you are 40 years old or older. Diabetes Have regular diabetes screenings. This checks your fasting blood sugar level. Have the screening done: Once every three years after age 40 if you are at a normal weight and have a low risk for diabetes. More often and at a younger age if you are overweight or have a high risk for diabetes. What should I know about preventing infection? Hepatitis B If you have a higher risk for hepatitis B, you should be screened for this virus. Talk with your health care provider to find out if you are at risk for hepatitis B infection. Hepatitis C Testing is recommended for: Everyone born from 1945 through 1965. Anyone with known risk factors for hepatitis C. Sexually transmitted infections (STIs) Get screened for STIs, including gonorrhea and chlamydia, if: You are sexually active and are younger than 46 years of age. You are older than 46 years of age and your health care provider tells you that you are at risk for this type of infection. Your sexual activity has changed since you were last screened, and you are at increased risk for chlamydia or gonorrhea. Ask your health care provider if you are at risk. Ask your health care provider about whether you are at high risk for HIV. Your health care provider may recommend a prescription medicine   to help prevent HIV infection. If you choose to take medicine to prevent HIV, you should first get tested for HIV. You should then be tested every 3 months for as long as you are taking the medicine. Pregnancy If you are about to stop having your period (premenopausal) and you may become pregnant, seek counseling before you get  pregnant. Take 400 to 800 micrograms (mcg) of folic acid every day if you become pregnant. Ask for birth control (contraception) if you want to prevent pregnancy. Osteoporosis and menopause Osteoporosis is a disease in which the bones lose minerals and strength with aging. This can result in bone fractures. If you are 65 years old or older, or if you are at risk for osteoporosis and fractures, ask your health care provider if you should: Be screened for bone loss. Take a calcium or vitamin D supplement to lower your risk of fractures. Be given hormone replacement therapy (HRT) to treat symptoms of menopause. Follow these instructions at home: Lifestyle Do not use any products that contain nicotine or tobacco, such as cigarettes, e-cigarettes, and chewing tobacco. If you need help quitting, ask your health care provider. Do not use street drugs. Do not share needles. Ask your health care provider for help if you need support or information about quitting drugs. Alcohol use Do not drink alcohol if: Your health care provider tells you not to drink. You are pregnant, may be pregnant, or are planning to become pregnant. If you drink alcohol: Limit how much you use to 0-1 drink a day. Limit intake if you are breastfeeding. Be aware of how much alcohol is in your drink. In the [place] one drink equals one 12 oz bottle of beer (355 mL), one 5 oz glass of wine (148 mL), or one 1 oz glass of hard liquor (44 mL). General instructions Schedule regular health, dental, and eye exams. Stay current with your vaccines. Tell your health care provider if: You often feel depressed. You have ever been abused or do not feel safe at home. Summary Adopting a healthy lifestyle and getting preventive care are important in promoting health and wellness. Follow your health care provider's instructions about healthy diet, exercising, and getting tested or screened for diseases. Follow your health care provider's  instructions on monitoring your cholesterol and blood pressure. This information is not intended to replace advice given to you by your health care provider. Make sure you discuss any questions you have with your health care provider. Document Revised: 04/02/2020 Document Reviewed: 01/16/2018 Elsevier Patient Education  2022 Elsevier Inc.  

## 2020-10-27 NOTE — Progress Notes (Signed)
I,Tianna Badgett,acting as a Education administrator for [employer], FNP.,have documented all relevant documentation on the behalf of Minette Brine, FNP,as directed by  Minette Brine, FNP while in the presence of Minette Brine, [place].  This visit occurred during the SARS-CoV-2 public health emergency.  Safety protocols were in place, including screening questions prior to the visit, additional usage of staff PPE, and extensive cleaning of exam room while observing appropriate contact time as indicated for disinfecting solutions.  Subjective:     Patient ID: Sarah Shaw , female    DOB: October 12, 1975 , 45 y.o.   MRN: [id number]   Chief Complaint  Patient presents with   Annual Exam    HPI  Here for HM. No current concerns    History reviewed. No pertinent past medical history.   Family History  Problem Relation Age of Onset   Healthy Mother    Cancer Father      Current Outpatient Medications:    ferrous sulfate 325 (65 FE) MG EC tablet, Take 1 tablet (325 mg total) by mouth daily with breakfast., Disp: 90 tablet, Rfl: 0   Vitamin D, Ergocalciferol, (DRISDOL) 1.25 MG (50000 UNIT) CAPS capsule, Take 1 capsule (50,000 Units total) by mouth every 7 (seven) days., Disp: 12 capsule, Rfl: 1   No Known Allergies    The patient states she uses vasectomy for birth control.  Patient's last menstrual period was 10/18/2020.. Negative for Dysmenorrhea and Negative for Menorrhagia. Negative for: breast discharge, breast lump(s), breast pain and breast self exam. Associated symptoms include abnormal vaginal bleeding. Pertinent negatives include abnormal bleeding (hematology), anxiety, decreased libido, depression, difficulty falling sleep, dyspareunia, history of infertility, nocturia, sexual dysfunction, sleep disturbances, urinary incontinence, urinary urgency, vaginal discharge and vaginal itching. Diet regular. The patient states her exercise level is moderate 3-4 times a week.  The patient's tobacco use is:   Social History   Tobacco Use  Smoking Status Never  Smokeless Tobacco Never   She has been exposed to passive smoke. The patient's alcohol use is:  Social History   Substance and Sexual Activity  Alcohol Use No   Additional information: Last pap 01/25/2019, next one scheduled for 01/24/2022.    Review of Systems  Constitutional: Negative.   HENT: Negative.    Eyes: Negative.   Respiratory: Negative.    Cardiovascular: Negative.   Gastrointestinal: Negative.   Endocrine: Negative.   Genitourinary: Negative.   Musculoskeletal: Negative.   Skin: Negative.   Allergic/Immunologic: Negative.   Neurological: Negative.   Hematological: Negative.   Psychiatric/Behavioral: Negative.      Today's Vitals   10/27/20 0852  BP: 124/68  Pulse: 67  Temp: 98.1 F (36.7 C)  TempSrc: Oral  Weight: 207 lb 3.2 oz (94 kg)  Height: 5' 6.2" (1.681 m)   Body mass index is 33.24 kg/m.   Objective:  Physical Exam Constitutional:      General: She is not in acute distress.    Appearance: Normal appearance. She is well-developed. She is obese.  HENT:     Head: Normocephalic and atraumatic.     Right Ear: Hearing, tympanic membrane, ear canal and external ear normal. There is no impacted cerumen.     Left Ear: Hearing, tympanic membrane, ear canal and external ear normal. There is no impacted cerumen.     Nose:     Comments: Deferred - masked    Mouth/Throat:     Comments: Deferred - masked Eyes:     General: Lids are normal.  Extraocular Movements: Extraocular movements intact.     Conjunctiva/sclera: Conjunctivae normal.     Pupils: Pupils are equal, round, and reactive to light.     Funduscopic exam:    Right eye: No papilledema.        Left eye: No papilledema.  Neck:     Thyroid: No thyroid mass.     Vascular: No carotid bruit.  Cardiovascular:     Rate and Rhythm: Normal rate and regular rhythm.     Pulses: Normal pulses.     Heart sounds: Normal heart sounds. No  murmur heard. Pulmonary:     Effort: Pulmonary effort is normal.     Breath sounds: Normal breath sounds.  Abdominal:     General: Abdomen is flat. Bowel sounds are normal. There is no distension.     Palpations: Abdomen is soft.     Tenderness: There is no abdominal tenderness.  Genitourinary:    Rectum: Guaiac result negative.  Musculoskeletal:        General: No swelling. Normal range of motion.     Cervical back: Full passive range of motion without pain, normal range of motion and neck supple.     Right lower leg: No edema.     Left lower leg: No edema.  Skin:    General: Skin is warm and dry.     Capillary Refill: Capillary refill takes less than 2 seconds.  Neurological:     General: No focal deficit present.     Mental Status: She is alert and oriented to person, place, and time.     Cranial Nerves: No cranial nerve deficit.     Sensory: No sensory deficit.  Psychiatric:        Mood and Affect: Mood normal.        Behavior: Behavior normal.        Thought Content: Thought content normal.        Judgment: Judgment normal.        Assessment And Plan:     1. Annual physical exam Behavior modifications discussed and diet history reviewed.   Pt will continue to exercise regularly and modify diet with low GI, plant based foods and decrease intake of processed foods.  Recommend intake of daily multivitamin, Vitamin D, and calcium.  Recommend mammogram and colonoscopy (up to date) for preventive screenings, as well as recommend immunizations that include influenza, TDAP (up to date)  2. Abnormal glucose Comments: Improved to normal at last visit continue to avoid sugary foods and drinks - CMP14+EGFR  3. Vitamin D deficiency Comments: Continue with vitamin d 50,000 units weekly, level was slightly decreased at last visit  4. Elevated cholesterol Comments: Stable No current medications, diet controlled - Lipid panel  5. Impacted cerumen of left ear Water lavage done  with good results    Patient was given opportunity to ask questions. Patient verbalized understanding of the plan and was able to repeat key elements of the plan. All questions were answered to their satisfaction.   Minette Brine, FNP   I, Minette Brine, FNP, have reviewed all documentation for this visit. The documentation on 10/27/20 for the exam, diagnosis, procedures, and orders are all accurate and complete.  THE PATIENT IS ENCOURAGED TO PRACTICE SOCIAL DISTANCING DUE TO THE COVID-19 PANDEMIC.

## 2020-10-28 LAB — CMP14+EGFR
ALT: 11 IU/L (ref 0–32)
AST: 13 IU/L (ref 0–40)
Albumin/Globulin Ratio: 1.5 (ref 1.2–2.2)
Albumin: 4.3 g/dL (ref 3.8–4.8)
Alkaline Phosphatase: 58 IU/L (ref 44–121)
BUN/Creatinine Ratio: 11 (ref 9–23)
BUN: 11 mg/dL (ref 6–24)
Bilirubin Total: 0.3 mg/dL (ref 0.0–1.2)
CO2: 22 mmol/L (ref 20–29)
Calcium: 9.5 mg/dL (ref 8.7–10.2)
Chloride: 104 mmol/L (ref 96–106)
Creatinine, Ser: 1.02 mg/dL — ABNORMAL HIGH (ref 0.57–1.00)
Globulin, Total: 2.9 g/dL (ref 1.5–4.5)
Glucose: 91 mg/dL (ref 65–99)
Potassium: 4.4 mmol/L (ref 3.5–5.2)
Sodium: 141 mmol/L (ref 134–144)
Total Protein: 7.2 g/dL (ref 6.0–8.5)
eGFR: 69 mL/min/{1.73_m2} (ref >59)

## 2020-10-28 LAB — LIPID PANEL
Chol/HDL Ratio: 4.4 ratio (ref 0.0–4.4)
Cholesterol, Total: 199 mg/dL (ref 100–199)
HDL: 45 mg/dL (ref >39)
LDL Chol Calc (NIH): 138 mg/dL — ABNORMAL HIGH (ref 0–99)
Triglycerides: 87 mg/dL (ref 0–149)
VLDL Cholesterol Cal: 16 mg/dL (ref 5–40)

## 2021-04-07 DIAGNOSIS — H524 Presbyopia: Secondary | ICD-10-CM | POA: Diagnosis not present

## 2021-04-07 DIAGNOSIS — H52223 Regular astigmatism, bilateral: Secondary | ICD-10-CM | POA: Diagnosis not present

## 2021-04-07 DIAGNOSIS — H5212 Myopia, left eye: Secondary | ICD-10-CM | POA: Diagnosis not present

## 2021-04-14 ENCOUNTER — Other Ambulatory Visit (HOSPITAL_COMMUNITY): Payer: Self-pay

## 2021-04-27 ENCOUNTER — Ambulatory Visit: Payer: 59 | Admitting: Nurse Practitioner

## 2021-05-03 ENCOUNTER — Ambulatory Visit: Payer: 59 | Admitting: Nurse Practitioner

## 2021-05-05 ENCOUNTER — Encounter: Payer: Self-pay | Admitting: Nurse Practitioner

## 2021-05-05 ENCOUNTER — Ambulatory Visit: Payer: 59 | Admitting: Nurse Practitioner

## 2021-05-05 VITALS — BP 112/64 | HR 79 | Temp 98.0°F | Ht 66.2 in | Wt 204.0 lb

## 2021-05-05 DIAGNOSIS — E7800 Pure hypercholesterolemia, unspecified: Secondary | ICD-10-CM

## 2021-05-05 DIAGNOSIS — E6609 Other obesity due to excess calories: Secondary | ICD-10-CM | POA: Diagnosis not present

## 2021-05-05 DIAGNOSIS — E611 Iron deficiency: Secondary | ICD-10-CM | POA: Diagnosis not present

## 2021-05-05 DIAGNOSIS — E559 Vitamin D deficiency, unspecified: Secondary | ICD-10-CM

## 2021-05-05 DIAGNOSIS — Z6832 Body mass index (BMI) 32.0-32.9, adult: Secondary | ICD-10-CM | POA: Diagnosis not present

## 2021-05-05 NOTE — Patient Instructions (Addendum)
Iron Deficiency Anemia, Adult ?Iron deficiency anemia is when you do not have enough red blood cells or hemoglobin in your blood. This happens because you have too little iron in your body. Hemoglobin carries oxygen to parts of the body. Anemia can cause your body to not get enough oxygen. ?What are the causes? ?Not eating enough foods that have iron in them. ?The body not being able to take in iron well. ?Needing more iron due to pregnancy or heavy menstrual periods, for females. ?Cancer. ?Bleeding in the bowels. ?Many blood draws. ?What increases the risk? ?Being pregnant. ?Being a teenage girl going through a growth spurt. ?What are the signs or symptoms? ?Pale skin, lips, and nails. ?Weakness, dizziness, and getting tired easily. ?Headache. ?Feeling like you cannot breathe well when moving (shortness of breath). ?Cold hands and feet. ?Fast heartbeat or a heartbeat that is not regular. ?Feeling grouchy (irritable) or breathing fast. These are more common in very bad anemia. ?Mild anemia may not cause any symptoms. ?How is this treated? ?This condition is treated by finding out why you do not have enough iron and then getting more iron. It may include: ?Adding foods to your diet that have a lot of iron. ?Taking iron pills (supplements). If you are pregnant or breastfeeding, you may need to take extra iron. Your diet often does not provide the amount of iron that you need. ?Getting more vitamin C in your diet. Vitamin C helps your body take in iron. You may need to take iron pills with a glass of orange juice or vitamin C pills. ?Medicines to make heavy menstrual periods lighter. ?Surgery. ?You may need blood tests to see if treatment is working. If the treatment does not seem to be working, you may need more tests. ?Follow these instructions at home: ?Medicines ?Take over-the-counter and prescription medicines only as told by your doctor. This includes iron pills and vitamins. ?Take iron pills when your stomach is  empty. If you cannot handle this, take them with food. ?Do not drink milk or take antacids at the same time as your iron pills. ?Iron pills may turn your poop (stool)black. ?If you cannot handle taking iron pills by mouth, ask your doctor about getting iron through: ?An IV tube. ?A shot (injection) into a muscle. ?Eating and drinking ? ?Talk with your doctor before changing the foods you eat. He or she may tell you to eat foods that have a lot of iron, such as: ?Liver. ?Low-fat (lean) beef. ?Breads and cereals that have iron added to them. ?Eggs. ?Dried fruit. ?Dark green, leafy vegetables. ?Eat fresh fruits and vegetables that are high in vitamin C. They help your body use iron. Foods with a lot of vitamin C include: ?Oranges. ?Peppers. ?Tomatoes. ?Mangoes. ?Drink enough fluid to keep your pee (urine) pale yellow. ?Managing constipation ?If you are taking iron pills, they may cause trouble pooping (constipation). To prevent or treat trouble pooping, you may need to: ?Take over-the-counter or prescription medicines. ?Eat foods that are high in fiber. These include beans, whole grains, and fresh fruits and vegetables. ?Limit foods that are high in fat and sugar. These include fried or sweet foods. ?General instructions ?Return to your normal activities as told by your doctor. Ask your doctor what activities are safe for you. ?Keep yourself clean, and keep things clean around you. ?Keep all follow-up visits as told by your doctor. This is important. ?Contact a doctor if: ?You feel like you may vomit (nauseous), or you vomit. ?You  feel weak. ?You are sweating for no reason. ?You have trouble pooping, such as: ?Pooping less than 3 times a week. ?Straining to poop. ?Having poop that is hard, dry, or larger than normal. ?Feeling full or bloated. ?Pain in the lower belly. ?Not feeling better after pooping. ?Get help right away if: ?You pass out (faint). ?You have chest pain. ?You have trouble breathing that: ?Is very  bad. ?Gets worse with physical activity. ?You have a fast heartbeat, or a heartbeat that does not feel regular. ?You get light-headed when getting up from sitting or lying down. ?These symptoms may be an emergency. Do not wait to see if the symptoms will go away. Get medical help right away. Call your local emergency services (911 in the [place] Do not drive yourself to the hospital. ?Summary ?Iron deficiency anemia is when you have too little iron in your body. ?This condition is treated by finding out why you do not have enough iron in your body and then getting more iron. ?Take over-the-counter and prescription medicines only as told by your doctor. ?Eat fresh fruits and vegetables that are high in vitamin C. ?Get help right away if you cannot breathe well. ?This information is not intended to replace advice given to you by your health care provider. Make sure you discuss any questions you have with your health care provider. ?Document Revised: 10/01/2018 Document Reviewed: 10/01/2018 ?Elsevier Patient Education ? [place]. ? ? ?Heart-Healthy Eating Plan ?Many factors influence your heart (coronary) health, including eating and exercise habits. Coronary risk increases with abnormal blood fat (lipid) levels. Heart-healthy meal planning includes limiting unhealthy fats, increasing healthy fats, and making other diet and lifestyle changes. ?What is my plan? ?Your health care provider may recommend that you: ?Limit your fat intake to _________% or less of your total calories each day. ?Limit your saturated fat intake to _________% or less of your total calories each day. ?Limit the amount of cholesterol in your diet to less than _________ mg per day. ?What are tips for following this plan? ?Cooking ?Cook foods using methods other than frying. Baking, boiling, grilling, and broiling are all good options. Other ways to reduce fat include: ?Removing the skin from poultry. ?Removing all visible fats from  meats. ?Steaming vegetables in water or broth. ?Meal planning ? ?At meals, imagine dividing your plate into fourths: ?Fill one-half of your plate with vegetables and green salads. ?Fill one-fourth of your plate with whole grains. ?Fill one-fourth of your plate with lean protein foods. ?Eat 4-5 servings of vegetables per day. One serving equals 1 cup raw or cooked vegetable, or 2 cups raw leafy greens. ?Eat 4-5 servings of fruit per day. One serving equals 1 medium whole fruit, ? cup dried fruit, ? cup fresh, frozen, or canned fruit, or ? cup 100% fruit juice. ?Eat more foods that contain soluble fiber. Examples include apples, broccoli, carrots, beans, peas, and barley. Aim to get 25-30 g of fiber per day. ?Increase your consumption of legumes, nuts, and seeds to 4-5 servings per week. One serving of dried beans or legumes equals ? cup cooked, 1 serving of nuts is ? cup, and 1 serving of seeds equals 1 tablespoon. ?Fats ?Choose healthy fats more often. Choose monounsaturated and polyunsaturated fats, such as olive and canola oils, flaxseeds, walnuts, almonds, and seeds. ?Eat more omega-3 fats. Choose salmon, mackerel, sardines, tuna, flaxseed oil, and ground flaxseeds. Aim to eat fish at least 2 times each week. ?Check food labels carefully to identify  foods with trans fats or high amounts of saturated fat. ?Limit saturated fats. These are found in animal products, such as meats, butter, and cream. Plant sources of saturated fats include palm oil, palm kernel oil, and coconut oil. ?Avoid foods with partially hydrogenated oils in them. These contain trans fats. Examples are stick margarine, some tub margarines, cookies, crackers, and other baked goods. ?Avoid fried foods. ?General information ?Eat more home-cooked food and less restaurant, buffet, and fast food. ?Limit or avoid alcohol. ?Limit foods that are high in starch and sugar. ?Lose weight if you are overweight. Losing just 5-10% of your body weight can help  your overall health and prevent diseases such as diabetes and heart disease. ?Monitor your salt (sodium) intake, especially if you have high blood pressure. Talk with your health care provider about your sodium in

## 2021-05-05 NOTE — Progress Notes (Signed)
?Industrial/product designer as a Education administrator for [employer], FNP.,have documented all relevant documentation on the behalf of Minette Brine, FNP,as directed by  Minette Brine, FNP while in the presence of Minette Brine, [place]. ? ?This visit occurred during the SARS-CoV-2 public health emergency.  Safety protocols were in place, including screening questions prior to the visit, additional usage of staff PPE, and extensive cleaning of exam room while observing appropriate contact time as indicated for disinfecting solutions. ? ?Subjective:  ?  ? Patient ID: Sarah Shaw , female    DOB: 02-27-1975 , 46 y.o.   MRN: [id number] ? ? ?Chief Complaint  ?Patient presents with  ? iron deficiency anemia  ? ? ?HPI ? ?Patient presents today for follow up for iron deficiency. She is taking iron supplement every other day and vitamin d weekly.  She does have heavy menses ?  ? ?History reviewed. No pertinent past medical history.  ? ?Family History  ?Problem Relation Age of Onset  ? Healthy Mother   ? Cancer Father   ? ? ? ?Current Outpatient Medications:  ?  ferrous sulfate 325 (65 FE) MG EC tablet, Take 1 tablet (325 mg total) by mouth daily with breakfast., Disp: 90 tablet, Rfl: 0 ?  Vitamin D, Ergocalciferol, (DRISDOL) 1.25 MG (50000 UNIT) CAPS capsule, Take 1 capsule (50,000 Units total) by mouth every 7 (seven) days., Disp: 12 capsule, Rfl: 1  ? ?No Known Allergies  ? ?Review of Systems  ?Constitutional: Negative.   ?Respiratory: Negative.    ?Cardiovascular: Negative.   ?Gastrointestinal: Negative.   ?Neurological: Negative.    ? ?Today's Vitals  ? 05/05/21 1207  ?BP: 112/64  ?Pulse: 79  ?Temp: 98 ?F (36.7 ?C)  ?TempSrc: Oral  ?Weight: 204 lb (92.5 kg)  ?Height: 5' 6.2" (1.681 m)  ? ?Body mass index is 32.73 kg/m?.  ?Wt Readings from Last 3 Encounters:  ?05/05/21 204 lb (92.5 kg)  ?10/27/20 207 lb 3.2 oz (94 kg)  ?08/11/20 212 lb 12.8 oz (96.5 kg)  ? ? ?Objective:  ?Physical Exam ?Vitals reviewed.  ?Constitutional:   ?   General: She  is not in acute distress. ?   Appearance: Normal appearance. She is obese.  ?Cardiovascular:  ?   Rate and Rhythm: Normal rate and regular rhythm.  ?   Pulses: Normal pulses.  ?   Heart sounds: Normal heart sounds. No murmur heard. ?Pulmonary:  ?   Effort: Pulmonary effort is normal. No respiratory distress.  ?   Breath sounds: Normal breath sounds. No wheezing.  ?Musculoskeletal:  ?   Cervical back: Normal range of motion and neck supple.  ?Skin: ?   General: Skin is warm and dry.  ?   Capillary Refill: Capillary refill takes less than 2 seconds.  ?Neurological:  ?   General: No focal deficit present.  ?   Mental Status: She is alert and oriented to person, place, and time.  ?   Cranial Nerves: No cranial nerve deficit.  ?   Motor: No weakness.  ?Psychiatric:     ?   Mood and Affect: Mood normal.     ?   Behavior: Behavior normal.     ?   Thought Content: Thought content normal.     ?   Judgment: Judgment normal.  ?  ? ?   ?Assessment And Plan:  ?   ?1. Vitamin D deficiency ?Comments: She is not taking her vitamin d regularly.  ?- VITAMIN D 25 Hydroxy (Vit-D Deficiency, Fractures) ? ?  2. Low iron ?Comments: Will check iron level, she has not been taking her supplement regularly ?- BMP8+EGFR ?- Iron, TIBC and Ferritin Panel ? ?3. Elevated cholesterol ?Comments: Diet controlled, continue with low fat diet.  ?- Lipid panel ?- BMP8+EGFR ? ?4. Class 1 obesity due to excess calories without serious comorbidity with body mass index (BMI) of 32.0 to 32.9 in adult ?She is encouraged to strive for BMI less than 30 to decrease cardiac risk. Advised to aim for at least 150 minutes of exercise per week. ? ? ? ?Patient was given opportunity to ask questions. Patient verbalized understanding of the plan and was able to repeat key elements of the plan. All questions were answered to their satisfaction.  ?Minette Brine, FNP  ? ?I, Minette Brine, FNP, have reviewed all documentation for this visit. The documentation on 05/05/21 for the  exam, diagnosis, procedures, and orders are all accurate and complete.  ? ?IF YOU HAVE BEEN REFERRED TO A SPECIALIST, IT MAY TAKE 1-2 WEEKS TO SCHEDULE/PROCESS THE REFERRAL. IF YOU HAVE NOT HEARD FROM US/SPECIALIST IN TWO WEEKS, PLEASE GIVE [place] A CALL AT [phone].  ? ?THE PATIENT IS ENCOURAGED TO PRACTICE SOCIAL DISTANCING DUE TO THE COVID-19 PANDEMIC.   ?

## 2021-05-06 LAB — LIPID PANEL
Chol/HDL Ratio: 3.7 ratio (ref 0.0–4.4)
Cholesterol, Total: 185 mg/dL (ref 100–199)
HDL: 50 mg/dL (ref >39)
LDL Chol Calc (NIH): 122 mg/dL — ABNORMAL HIGH (ref 0–99)
Triglycerides: 68 mg/dL (ref 0–149)
VLDL Cholesterol Cal: 13 mg/dL (ref 5–40)

## 2021-05-06 LAB — IRON,TIBC AND FERRITIN PANEL
Ferritin: 8 ng/mL — ABNORMAL LOW (ref 15–150)
Iron Saturation: 6 % — CL (ref 15–55)
Iron: 26 ug/dL — ABNORMAL LOW (ref 27–159)
Total Iron Binding Capacity: 454 ug/dL — ABNORMAL HIGH (ref 250–450)
UIBC: 428 ug/dL — ABNORMAL HIGH (ref 131–425)

## 2021-05-06 LAB — BMP8+EGFR
BUN/Creatinine Ratio: 11 (ref 9–23)
BUN: 10 mg/dL (ref 6–24)
CO2: 23 mmol/L (ref 20–29)
Calcium: 9.6 mg/dL (ref 8.7–10.2)
Chloride: 104 mmol/L (ref 96–106)
Creatinine, Ser: 0.87 mg/dL (ref 0.57–1.00)
Glucose: 92 mg/dL (ref 70–99)
Potassium: 4.3 mmol/L (ref 3.5–5.2)
Sodium: 138 mmol/L (ref 134–144)
eGFR: 83 mL/min/{1.73_m2} (ref >59)

## 2021-05-06 LAB — VITAMIN D 25 HYDROXY (VIT D DEFICIENCY, FRACTURES): Vit D, 25-Hydroxy: 27.2 ng/mL — ABNORMAL LOW (ref 30.0–100.0)

## 2021-05-09 ENCOUNTER — Encounter: Payer: Self-pay | Admitting: Nurse Practitioner

## 2021-05-16 ENCOUNTER — Other Ambulatory Visit (HOSPITAL_COMMUNITY): Payer: Self-pay

## 2021-05-16 DIAGNOSIS — Z01419 Encounter for gynecological examination (general) (routine) without abnormal findings: Secondary | ICD-10-CM | POA: Diagnosis not present

## 2021-05-16 DIAGNOSIS — Z1231 Encounter for screening mammogram for malignant neoplasm of breast: Secondary | ICD-10-CM | POA: Diagnosis not present

## 2021-05-16 DIAGNOSIS — Z1239 Encounter for other screening for malignant neoplasm of breast: Secondary | ICD-10-CM | POA: Diagnosis not present

## 2021-05-16 DIAGNOSIS — Z124 Encounter for screening for malignant neoplasm of cervix: Secondary | ICD-10-CM | POA: Diagnosis not present

## 2021-05-16 MED ORDER — VITAMIN D (ERGOCALCIFEROL) 1.25 MG (50000 UNIT) PO CAPS
50000.0000 [IU] | ORAL_CAPSULE | ORAL | 1 refills | Status: DC
  Filled 2021-05-16 – 2021-07-11 (×2): qty 12, 84d supply, fill #0
  Filled 2021-10-10: qty 12, 84d supply, fill #1

## 2021-05-31 ENCOUNTER — Other Ambulatory Visit: Payer: Self-pay | Admitting: Nurse Practitioner

## 2021-05-31 ENCOUNTER — Ambulatory Visit (INDEPENDENT_AMBULATORY_CARE_PROVIDER_SITE_OTHER): Payer: 59

## 2021-05-31 ENCOUNTER — Ambulatory Visit: Payer: 59 | Admitting: Podiatry

## 2021-05-31 ENCOUNTER — Encounter: Payer: Self-pay | Admitting: Podiatry

## 2021-05-31 DIAGNOSIS — E669 Obesity, unspecified: Secondary | ICD-10-CM | POA: Insufficient documentation

## 2021-05-31 DIAGNOSIS — M21611 Bunion of right foot: Secondary | ICD-10-CM

## 2021-05-31 DIAGNOSIS — M84374A Stress fracture, right foot, initial encounter for fracture: Secondary | ICD-10-CM

## 2021-05-31 DIAGNOSIS — E611 Iron deficiency: Secondary | ICD-10-CM

## 2021-05-31 DIAGNOSIS — E66811 Obesity, class 1: Secondary | ICD-10-CM | POA: Insufficient documentation

## 2021-05-31 LAB — HM PAP SMEAR: HPV, high-risk: NEGATIVE

## 2021-05-31 MED ORDER — ACCRUFER 30 MG PO CAPS: 1.0000 | ORAL_CAPSULE | Freq: Every day | ORAL | 5 refills | Status: DC

## 2021-06-03 ENCOUNTER — Encounter: Payer: Self-pay | Admitting: Podiatry

## 2021-06-03 NOTE — Progress Notes (Signed)
?  Subjective:  ?Patient ID: Sarah Shaw, female    DOB: 17-Aug-1975,  MRN: [id number] ? ?Chief Complaint  ?Patient presents with  ? Bunions  ?   NP R foot burning pain at the 2nd toe, sometimes bunion pain x 1 month   ? ? ?46 y.o. female presents with the above complaint. History confirmed with patient.  She notices the pain sometimes over the bump and most often between the second and third toes across the foot.  Has been going on and worsening for about 1 month.  She works in the [place] at [employer]. ? ?Objective:  ?Physical Exam: ?warm, good capillary refill, no trophic changes or ulcerative lesions, normal DP and PT pulses, and normal sensory exam. ?Left Foot: normal exam, no swelling, tenderness, instability; ligaments intact, full range of motion of all ankle/foot joints ?Right Foot:  She has mild hallux valgus deformity with good range of motion.  No evidence of neuroma or plantar plate tear. ? ?No images are attached to the encounter. ? ?Radiographs: ?Multiple views x-ray of the right foot: Hallux valgus deformity, there is a longer second ray ?Assessment:  ? ?1. Bunion, right foot   ? ? ? ?Plan:  ?Patient was evaluated and treated and all questions answered. ? ?I discussed with her that she has hallux valgus deformity we discussed the etiology and treatment option of these.  Her pain is not classic bunion pain she does have some pain of the second metatarsal as well.  Her x-ray does show a slightly elongated second ray.  We discussed possibility of a developing stress fracture here and she may have an early stress reaction although this was not clearly evident on her x-rays.  We discussed the option of an MRI but this likely would not change her management at this point.  We discussed shoe gear that is supportive.  I will see her back in 1 month and reevaluate. ? ?Return in about 1 month (around 06/30/2021) for f/u possible stress fracture R foot, bunion .  ? ?

## 2021-07-05 ENCOUNTER — Ambulatory Visit (INDEPENDENT_AMBULATORY_CARE_PROVIDER_SITE_OTHER): Payer: 59

## 2021-07-05 ENCOUNTER — Ambulatory Visit: Payer: 59 | Admitting: Podiatry

## 2021-07-05 DIAGNOSIS — M84374A Stress fracture, right foot, initial encounter for fracture: Secondary | ICD-10-CM | POA: Diagnosis not present

## 2021-07-05 DIAGNOSIS — M21611 Bunion of right foot: Secondary | ICD-10-CM | POA: Diagnosis not present

## 2021-07-05 NOTE — Patient Instructions (Addendum)
Let me know how you are doing in 4 weeks. If pain is no better or worse, we will do an MRI. If your pain is improving you can stop wearing the boot and go back to regular shoes.

## 2021-07-06 ENCOUNTER — Encounter: Payer: Self-pay | Admitting: Podiatry

## 2021-07-06 MED ORDER — MELOXICAM 15 MG PO TABS
15.0000 mg | ORAL_TABLET | Freq: Every day | ORAL | 0 refills | Status: DC
  Filled 2021-07-06: qty 30, 30d supply, fill #0

## 2021-07-06 NOTE — Progress Notes (Signed)
  Subjective:  Patient ID: Sarah Shaw, female    DOB: Oct 06, 1975,  MRN: [id number]  Chief Complaint  Patient presents with   Bunions     f/u possible stress fracture R foot, bunion .    46 y.o. female presents with the above complaint. History confirmed with patient.  She notices the pain sometimes over the bump and most often between the second and third toes across the foot.  Has been going on and worsening for about 1 month.  She works in the [place] at [employer].  Interval history: Overall feels about the same has not had much change  Objective:  Physical Exam: warm, good capillary refill, no trophic changes or ulcerative lesions, normal DP and PT pulses, and normal sensory exam. Left Foot: normal exam, no swelling, tenderness, instability; ligaments intact, full range of motion of all ankle/foot joints Right Foot:  She has mild hallux valgus deformity with good range of motion.  No evidence of neuroma or plantar plate tear. Still persistent pain around the second metatarsal as well    Radiographs: Multiple views x-ray of the right foot: New radiographs taken today show unchanged alignment of hallux valgus deformity, there is an elongated second ray, no worsening or evident stress fracture Assessment:   1. Bunion, right foot   2. Stress reaction of right foot, initial encounter      Plan:  Patient was evaluated and treated and all questions answered.  Still continues to have quite a bit of pain.  Has not changed much.  There is no evidence of definitive stress fracture on her x-ray today.  We discussed getting an MRI to evaluate for possible stress reaction but again this likely would not change her management of protected weightbearing in a boot and anti-inflammatory for now.  I prescribed her meloxicam to take daily.  I dispensed a short cam boot to support the foot and offload pressure she may be WBAT in this.  She will let me know how she is doing in about 4 or 5 weeks, if not  much improvement with plan for MRI at that point.  Return if symptoms worsen or fail to improve.

## 2021-07-07 ENCOUNTER — Other Ambulatory Visit (HOSPITAL_COMMUNITY): Payer: Self-pay

## 2021-07-11 ENCOUNTER — Other Ambulatory Visit (HOSPITAL_COMMUNITY): Payer: Self-pay

## 2021-08-11 DIAGNOSIS — K449 Diaphragmatic hernia without obstruction or gangrene: Secondary | ICD-10-CM | POA: Diagnosis not present

## 2021-08-11 DIAGNOSIS — K5900 Constipation, unspecified: Secondary | ICD-10-CM | POA: Diagnosis not present

## 2021-08-11 DIAGNOSIS — K625 Hemorrhage of anus and rectum: Secondary | ICD-10-CM | POA: Diagnosis not present

## 2021-08-11 DIAGNOSIS — E669 Obesity, unspecified: Secondary | ICD-10-CM | POA: Diagnosis not present

## 2021-08-11 DIAGNOSIS — Z800 Family history of malignant neoplasm of digestive organs: Secondary | ICD-10-CM | POA: Diagnosis not present

## 2021-08-11 DIAGNOSIS — R1033 Periumbilical pain: Secondary | ICD-10-CM | POA: Diagnosis not present

## 2021-08-11 DIAGNOSIS — K219 Gastro-esophageal reflux disease without esophagitis: Secondary | ICD-10-CM | POA: Diagnosis not present

## 2021-08-16 ENCOUNTER — Other Ambulatory Visit (HOSPITAL_COMMUNITY): Payer: Self-pay

## 2021-08-16 MED ORDER — FAMOTIDINE 40 MG PO TABS
40.0000 mg | ORAL_TABLET | Freq: Two times a day (BID) | ORAL | 4 refills | Status: DC
  Filled 2021-08-16: qty 180, 90d supply, fill #0

## 2021-08-24 ENCOUNTER — Other Ambulatory Visit (HOSPITAL_COMMUNITY): Payer: Self-pay

## 2021-10-11 ENCOUNTER — Other Ambulatory Visit (HOSPITAL_COMMUNITY): Payer: Self-pay

## 2021-11-03 ENCOUNTER — Encounter: Payer: 59 | Admitting: Nurse Practitioner

## 2021-11-30 ENCOUNTER — Other Ambulatory Visit (HOSPITAL_COMMUNITY): Payer: Self-pay

## 2021-11-30 DIAGNOSIS — Z0000 Encounter for general adult medical examination without abnormal findings: Secondary | ICD-10-CM | POA: Diagnosis not present

## 2021-11-30 DIAGNOSIS — Z87898 Personal history of other specified conditions: Secondary | ICD-10-CM | POA: Diagnosis not present

## 2021-11-30 DIAGNOSIS — D509 Iron deficiency anemia, unspecified: Secondary | ICD-10-CM | POA: Diagnosis not present

## 2021-11-30 DIAGNOSIS — E559 Vitamin D deficiency, unspecified: Secondary | ICD-10-CM | POA: Diagnosis not present

## 2021-11-30 MED ORDER — ERGOCALCIFEROL 1.25 MG (50000 UT) PO CAPS
50000.0000 [IU] | ORAL_CAPSULE | ORAL | 3 refills | Status: DC
  Filled 2021-11-30 – 2022-01-05 (×2): qty 12, 84d supply, fill #0
  Filled 2022-03-29: qty 12, 84d supply, fill #1
  Filled 2022-06-25: qty 12, 84d supply, fill #2

## 2022-01-06 ENCOUNTER — Other Ambulatory Visit (HOSPITAL_COMMUNITY): Payer: Self-pay

## 2022-03-03 DIAGNOSIS — D509 Iron deficiency anemia, unspecified: Secondary | ICD-10-CM | POA: Diagnosis not present

## 2022-03-17 DIAGNOSIS — D509 Iron deficiency anemia, unspecified: Secondary | ICD-10-CM | POA: Diagnosis not present

## 2022-03-17 DIAGNOSIS — R000 Tachycardia, unspecified: Secondary | ICD-10-CM | POA: Diagnosis not present

## 2022-03-18 DIAGNOSIS — R000 Tachycardia, unspecified: Secondary | ICD-10-CM | POA: Diagnosis not present

## 2022-04-04 NOTE — [facility] Notes (Unsigned)
Cardiology Office Note:   Date:  04/05/2022  NAME:  Sarah Shaw    MRN: [id number] DOB:  09/19/1975   PCP:  Minette Brine, FNP  Cardiologist:  None  Electrophysiologist:  None   Referring MD: Heywood Bene, *   Chief Complaint  Patient presents with   Tachycardia        History of Present Illness:   Sarah Shaw is a 47 y.o. female with a hx of anemia who is being seen today for the evaluation of tachycardia at the request of Sarah Shaw, Breejante J, *.  She reports she has had 3 episodes of her heart racing.  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  She reports on 02/22/2022 she was at work.  She had tachycardia up to 138 bpm.  She used a pulse ox to capture this.  Symptoms lasted 5 minutes.  She felt her heart racing.  She felt poor.  She had another episode on 03/16/2022.  Similar episode.  At work.  Heart racing for roughly 5 minutes.  Up to 113 bpm.  No EKG was captured.  She had a subsequent episode at home.  Symptoms lasted 15 minutes.  Heart rate up to 1 13-1 20.  She reports feeling poorly.  Again no chest pain no trouble breathing.  She really has no medical problems other than anemia.  This is attributed to heavy menstrual cycle.  Her EKG demonstrates normal sinus rhythm.  There is no strong family history of heart disease.  Recent labs including hemoglobin and thyroid are within limits.  She reports no exertional chest pain or pressure.  She is married.  She has 3 children.  She reports no significant stress.  Symptoms could represent arrhythmia but this is unclear.  Cr 0.79 TSH 1.48 HGB 11.4  Problem List  Anemia  -HGB 11.4  Past Medical History: Past Medical History:  Diagnosis Date   Anemia     Past Surgical History: History reviewed. No pertinent surgical history.  Current Medications: Current Meds  Medication Sig   cyanocobalamin 100 MCG tablet Take 100 mcg by mouth daily.   ferrous sulfate 325 (65 FE) MG tablet Take 325 mg by  mouth daily with breakfast.   Vitamin D, Ergocalciferol, (DRISDOL) 1.25 MG (50000 UNIT) CAPS capsule Take 1 capsule (50,000 Units total) by mouth every 7 (seven) days.     Allergies:    Patient has no known allergies.   Social History: Social History   Socioeconomic History   Marital status: Married    Spouse name: Not on file   Number of children: 3   Years of education: Not on file   Highest education level: Not on file  Occupational History   Occupation: Nurse Pre[employer]  Tobacco Use   Smoking status: Never   Smokeless tobacco: Never  Substance and Sexual Activity   Alcohol use: No   Drug use: No   Sexual activity: Yes  Other Topics Concern   Not on file  Social History Narrative   Not on file   Social Determinants of Health   Financial Resource Strain: Not on file  Food Insecurity: Not on file  Transportation Needs: Not on file  Physical Activity: Not on file  Stress: Not on file  Social Connections: Not on file     Family History: The patient's family history includes Cancer in her father; Healthy in her mother.  ROS:   All other ROS reviewed and  negative. Pertinent positives noted in the HPI.     EKGs/Labs/Other Studies Reviewed:   The following studies were personally reviewed by me today:  EKG:  EKG is ordered today.  The ekg ordered today demonstrates normal sinus rhythm heart rate 73, no acute ischemic changes or evidence of infarction, and was personally reviewed by me.   Recent Labs: 05/05/2021: BUN 10; Creatinine, Ser 0.87; Potassium 4.3; Sodium 138   Recent Lipid Panel    Component Value Date/Time   CHOL 185 05/05/2021 1243   TRIG 68 05/05/2021 1243   HDL 50 05/05/2021 1243   CHOLHDL 3.7 05/05/2021 1243   LDLCALC 122 (H) 05/05/2021 1243    Physical Exam:   VS:  BP 122/82   Pulse 75   Ht 5' 6.2" (1.681 m)   Wt 218 lb 12.8 oz (99.2 kg)   SpO2 98%   BMI 35.10 kg/m    Wt Readings from Last 3 Encounters:  04/05/22 218 lb 12.8 oz (99.2  kg)  05/05/21 204 lb (92.5 kg)  10/27/20 207 lb 3.2 oz (94 kg)    General: Well nourished, well developed, in no acute distress Head: Atraumatic, normal size  Eyes: PEERLA, EOMI  Neck: Supple, no JVD Endocrine: No thryomegaly Cardiac: Normal S1, S2; RRR; no murmurs, rubs, or gallops Lungs: Clear to auscultation bilaterally, no wheezing, rhonchi or rales  Abd: Soft, nontender, no hepatomegaly  Ext: No edema, pulses 2+ Musculoskeletal: No deformities, BUE and BLE strength normal and equal Skin: Warm and dry, no rashes   Neuro: Alert and oriented to person, place, time, and situation, CNII-XII grossly intact, no focal deficits  Psych: Normal mood and affect   ASSESSMENT:   [name] is a 47 y.o. female who presents for the following: 1. Tachycardia   2. Palpitations     PLAN:   1. Tachycardia 2. Palpitations -3 episodes of tachycardia.  Symptoms last 5 to 15 minutes.  Unclear what is going on here.  She does have anemia.  Symptoms could represent sinus tachycardia versus arrhythmia.  Her recent TSH is normal.  She is not drinking excess caffeine.  She is drinking plenty of water.  We will proceed with a 14-day Zio patch.  She does work as a Marine scientist in [employer].  I did inform her if symptoms happen again she should try to get an EKG as this will help [place].  She will do this.  She will see me back as needed based on the results of the monitor.  We will hold on echocardiogram until we know what we are dealing with.  Disposition: Return if symptoms worsen or fail to improve.  Medication Adjustments/Labs and Tests Ordered: Current medicines are reviewed at length with the patient today.  Concerns regarding medicines are outlined above.  Orders Placed This Encounter  Procedures   LONG TERM MONITOR (3-14 DAYS)   EKG 12-Lead   No orders of the defined types were placed in this encounter.   Patient Instructions  Medication Instructions:  The current medical regimen is effective;   continue present plan and medications.  *If you need a refill on your cardiac medications before your next appointment, please call your pharmacy*   Testing/Procedures:  [place] Monitor Instructions  Your physician has requested you wear a ZIO patch monitor for 14 days.  This is a single patch monitor. [employer] supplies one patch monitor per enrollment. Additional stickers are not available. Please do not apply patch if you will be  having a Nuclear Stress Test,  Echocardiogram, Cardiac CT, MRI, or Chest Xray during the period you would be wearing the  monitor. The patch cannot be worn during these tests. You cannot remove and re-apply the  ZIO XT patch monitor.  Your ZIO patch monitor will be mailed 3 day USPS to your address on file. It may take 3-5 days  to receive your monitor after you have been enrolled.  Once you have received your monitor, please review the enclosed instructions. Your monitor  has already been registered assigning a specific monitor serial # to you.  Billing and Patient Assistance Program Information  We have supplied [employer] with any of your insurance information on file for billing purposes. [employer] offers a sliding scale Patient Assistance Program for patients that do not have  insurance, or whose insurance does not completely cover the cost of the ZIO monitor.  You must apply for the Patient Assistance Program to qualify for this discounted rate.  To apply, please call [employer] at [phone], select option 4, select option 2, ask to apply for  Patient Assistance Program. Theodore Demark will ask your household income, and how many people  are in your household. They will quote your out-of-pocket cost based on that information.  [employer] will also be able to set up a 86-month interest-free payment plan if needed.  Applying the monitor   Shave hair from upper left chest.  Hold abrader disc by orange tab. Rub abrader in 40 strokes over the upper left chest  as  indicated in your monitor instructions.  Clean area with 4 enclosed alcohol pads. Let dry.  Apply patch as indicated in monitor instructions. Patch will be placed under collarbone on left  side of chest with arrow pointing upward.  Rub patch adhesive wings for 2 minutes. Remove white label marked "1". Remove the white  label marked "2". Rub patch adhesive wings for 2 additional minutes.  While looking in a mirror, press and release button in center of patch. A small green light will  flash 3-4 times. This will be your only indicator that the monitor has been turned on.  Do not shower for the first 24 hours. You may shower after the first 24 hours.  Press the button if you feel a symptom. You will hear a small click. Record Date, Time and  Symptom in the Patient Logbook.  When you are ready to remove the patch, follow instructions on the last 2 pages of Patient  Logbook. Stick patch monitor onto the last page of Patient Logbook.  Place Patient Logbook in the blue and white box. Use locking tab on box and tape box closed  securely. The blue and white box has prepaid postage on it. Please place it in the mailbox as  soon as possible. Your physician should have your test results approximately 7 days after the  monitor has been mailed back to [facility]  Call IRandlettat [phone] you have questions regarding  your ZIO XT patch monitor. Call them immediately if you see an orange light blinking on your  monitor.  If your monitor falls off in less than 4 days, contact our Monitor department at [phone]  If your monitor becomes loose or falls off after 4 days call [employer] at [phone]  suggestions on securing your monitor    Follow-Up: [facility] you and your health needs are our priority.  As part of our continuing mission to provide you with exceptional  heart care, we have created designated Provider Care Teams.  These Care Teams  include your primary Cardiologist (physician) and Advanced Practice Providers (APPs -  Physician Assistants and Nurse Practitioners) who all work together to provide you with the care you need, when you need it.  We recommend signing up for the patient portal called "MyChart".  Sign up information is provided on this After Visit Summary.  MyChart is used to connect with patients for Virtual Visits (Telemedicine).  Patients are able to view lab/test results, encounter notes, upcoming appointments, etc.  Non-urgent messages can be sent to your provider as well.   To learn more about what you can do with MyChart, go to [email or web address].    Your next appointment:   As needed  Provider:   Eleonore Chiquito, MD       Signed, Addison Naegeli. Audie Box, MD, [place]  [address] [place], [place] [address] [phone]  04/05/2022 3:30 PM

## 2022-04-05 ENCOUNTER — Encounter: Payer: Self-pay | Admitting: Cardiovascular Disease

## 2022-04-05 ENCOUNTER — Ambulatory Visit (INDEPENDENT_AMBULATORY_CARE_PROVIDER_SITE_OTHER): Payer: Commercial Managed Care - PPO

## 2022-04-05 ENCOUNTER — Ambulatory Visit: Payer: Commercial Managed Care - PPO | Attending: Cardiovascular Disease | Admitting: Cardiovascular Disease

## 2022-04-05 VITALS — BP 122/82 | HR 75 | Ht 66.2 in | Wt 218.8 lb

## 2022-04-05 DIAGNOSIS — R000 Tachycardia, unspecified: Secondary | ICD-10-CM | POA: Diagnosis not present

## 2022-04-05 DIAGNOSIS — R002 Palpitations: Secondary | ICD-10-CM | POA: Diagnosis not present

## 2022-04-05 NOTE — Patient Instructions (Signed)
Medication Instructions:  The current medical regimen is effective;  continue present plan and medications.  *If you need a refill on your cardiac medications before your next appointment, please call your pharmacy*   Testing/Procedures:  [place] Monitor Instructions  Your physician has requested you wear a ZIO patch monitor for 14 days.  This is a single patch monitor. [employer] supplies one patch monitor per enrollment. Additional stickers are not available. Please do not apply patch if you will be having a Nuclear Stress Test,  Echocardiogram, Cardiac CT, MRI, or Chest Xray during the period you would be wearing the  monitor. The patch cannot be worn during these tests. You cannot remove and re-apply the  ZIO XT patch monitor.  Your ZIO patch monitor will be mailed 3 day USPS to your address on file. It may take 3-5 days  to receive your monitor after you have been enrolled.  Once you have received your monitor, please review the enclosed instructions. Your monitor  has already been registered assigning a specific monitor serial # to you.  Billing and Patient Assistance Program Information  We have supplied [employer] with any of your insurance information on file for billing purposes. [employer] offers a sliding scale Patient Assistance Program for patients that do not have  insurance, or whose insurance does not completely cover the cost of the ZIO monitor.  You must apply for the Patient Assistance Program to qualify for this discounted rate.  To apply, please call [employer] at [phone], select option 4, select option 2, ask to apply for  Patient Assistance Program. Theodore Demark will ask your household income, and how many people  are in your household. They will quote your out-of-pocket cost based on that information.  [employer] will also be able to set up a 31-month interest-free payment plan if needed.  Applying the monitor   Shave hair from upper left chest.  Hold abrader  disc by orange tab. Rub abrader in 40 strokes over the upper left chest as  indicated in your monitor instructions.  Clean area with 4 enclosed alcohol pads. Let dry.  Apply patch as indicated in monitor instructions. Patch will be placed under collarbone on left  side of chest with arrow pointing upward.  Rub patch adhesive wings for 2 minutes. Remove white label marked "1". Remove the white  label marked "2". Rub patch adhesive wings for 2 additional minutes.  While looking in a mirror, press and release button in center of patch. A small green light will  flash 3-4 times. This will be your only indicator that the monitor has been turned on.  Do not shower for the first 24 hours. You may shower after the first 24 hours.  Press the button if you feel a symptom. You will hear a small click. Record Date, Time and  Symptom in the Patient Logbook.  When you are ready to remove the patch, follow instructions on the last 2 pages of Patient  Logbook. Stick patch monitor onto the last page of Patient Logbook.  Place Patient Logbook in the blue and white box. Use locking tab on box and tape box closed  securely. The blue and white box has prepaid postage on it. Please place it in the mailbox as  soon as possible. Your physician should have your test results approximately 7 days after the  monitor has been mailed [facility] to [facility]  Call IMysticat [phone] you have questions regarding  your ZIO  XT patch monitor. Call them immediately if you see an orange light blinking on your  monitor.  If your monitor falls off in less than 4 days, contact our Monitor department at [phone].  If your monitor becomes loose or falls off after 4 days call [employer] at [phone] for  suggestions on securing your monitor    Follow-Up: At [facility], you and your health needs are our priority.  As part of our continuing mission to provide you with exceptional heart  care, we have created designated Provider Care Teams.  These Care Teams include your primary Cardiologist (physician) and Advanced Practice Providers (APPs -  Physician Assistants and Nurse Practitioners) who all work together to provide you with the care you need, when you need it.  We recommend signing up for the patient portal called "MyChart".  Sign up information is provided on this After Visit Summary.  MyChart is used to connect with patients for Virtual Visits (Telemedicine).  Patients are able to view lab/test results, encounter notes, upcoming appointments, etc.  Non-urgent messages can be sent to your provider as well.   To learn more about what you can do with MyChart, go to [email or web address].    Your next appointment:   As needed  Provider:   Eleonore Chiquito, MD

## 2022-04-05 NOTE — [facility] Notes (Unsigned)
Enrolled for [employer] to mail a ZIO XT long term holter monitor to the patients address on file.  

## 2022-04-09 DIAGNOSIS — R000 Tachycardia, unspecified: Secondary | ICD-10-CM | POA: Diagnosis not present

## 2022-04-27 DIAGNOSIS — R000 Tachycardia, unspecified: Secondary | ICD-10-CM | POA: Diagnosis not present

## 2022-05-01 ENCOUNTER — Other Ambulatory Visit: Payer: Self-pay

## 2022-05-01 DIAGNOSIS — I471 Supraventricular tachycardia, unspecified: Secondary | ICD-10-CM

## 2022-05-25 ENCOUNTER — Other Ambulatory Visit (HOSPITAL_COMMUNITY): Payer: Commercial Managed Care - PPO

## 2022-05-25 ENCOUNTER — Ambulatory Visit (INDEPENDENT_AMBULATORY_CARE_PROVIDER_SITE_OTHER): Payer: Commercial Managed Care - PPO

## 2022-05-25 DIAGNOSIS — I471 Supraventricular tachycardia, unspecified: Secondary | ICD-10-CM | POA: Diagnosis not present

## 2022-05-25 LAB — ECHOCARDIOGRAM COMPLETE
Area-P 1/2: 4.15 cm2
S' Lateral: 2.78 cm

## 2022-06-14 DIAGNOSIS — Z01419 Encounter for gynecological examination (general) (routine) without abnormal findings: Secondary | ICD-10-CM | POA: Diagnosis not present

## 2022-06-14 DIAGNOSIS — Z1231 Encounter for screening mammogram for malignant neoplasm of breast: Secondary | ICD-10-CM | POA: Diagnosis not present

## 2022-06-14 DIAGNOSIS — E669 Obesity, unspecified: Secondary | ICD-10-CM | POA: Diagnosis not present

## 2022-06-14 DIAGNOSIS — R7309 Other abnormal glucose: Secondary | ICD-10-CM | POA: Diagnosis not present

## 2022-06-14 DIAGNOSIS — D259 Leiomyoma of uterus, unspecified: Secondary | ICD-10-CM | POA: Diagnosis not present

## 2022-06-14 DIAGNOSIS — D649 Anemia, unspecified: Secondary | ICD-10-CM | POA: Diagnosis not present

## 2022-06-14 DIAGNOSIS — E559 Vitamin D deficiency, unspecified: Secondary | ICD-10-CM | POA: Diagnosis not present

## 2022-06-14 LAB — HM MAMMOGRAPHY

## 2022-06-19 NOTE — [facility] Notes (Unsigned)
Cardiology Office Note:   Date:  06/20/2022  NAME:  Sarah Shaw    MRN: [id number] DOB:  October 13, 1975   PCP:  Arnette Felts, FNP  Cardiologist:  None  Electrophysiologist:  None   Referring MD: Arnette Felts, FNP   Chief Complaint  Patient presents with   Follow-up        History of Present Illness:   Sarah Shaw is a 47 y.o. female with a hx of SVT who presents for follow-up.  She reports she is doing well.  We discussed the results of her monitor.  Brief SVT.  No further episodes.  Echo was normal.  Denies any chest pains or trouble breathing.  Overall doing quite well.  She has had no further episodes.  We discussed watchful waiting.  She does not want to do any medication unless needed.  None is indicated.  Overall doing quite well.  Problem List SVT -brief episodes (4 in 14 days, longest 4.8 seconds)  Past Medical History: Past Medical History:  Diagnosis Date   Anemia     Past Surgical History: History reviewed. No pertinent surgical history.  Current Medications: Current Meds  Medication Sig   cyanocobalamin 100 MCG tablet Take 100 mcg by mouth daily.   ferrous sulfate 325 (65 FE) MG tablet Take 325 mg by mouth daily with breakfast.   Vitamin D, Ergocalciferol, (DRISDOL) 1.25 MG (50000 UNIT) CAPS capsule Take 1 capsule (50,000 Units total) by mouth every 7 (seven) days.     Allergies:    Patient has no known allergies.   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  ROS:   All other ROS reviewed and negative. Pertinent positives noted in the HPI.     EKGs/Labs/Other Studies Reviewed:   The following studies were personally reviewed by me today:   TTE 05/25/2022  1. Left ventricular ejection fraction by 3D volume is 63 %. The left  ventricle has normal function. The left ventricle has no regional wall  motion abnormalities. Left ventricular diastolic parameters are  indeterminate. The average left ventricular  global longitudinal strain is -23.1 %. The global longitudinal strain is  normal.   2. Right ventricular systolic function is normal. The right ventricular  size is normal.   3. The mitral valve is normal in structure. Mild mitral valve  regurgitation. No evidence of mitral stenosis.   4. Tricuspid valve regurgitation is mild to moderate.   5. The aortic valve is normal in structure. Aortic valve regurgitation is  not visualized. No aortic stenosis is present.   6.  The inferior vena cava is normal in size with greater than 50%  respiratory variability, suggesting right atrial pressure of 3 mmHg.   Recent Labs: No results found for requested labs within last 365 days.   Recent Lipid Panel    Component Value Date/Time   CHOL 185 05/05/2021 1243   TRIG 68 05/05/2021 1243   HDL 50 05/05/2021 1243   CHOLHDL 3.7 05/05/2021 1243   LDLCALC 122 (H) 05/05/2021 1243    Physical Exam:   VS:  BP 124/80 (BP Location: Left Arm, Patient Position: Sitting, Cuff Size: Large)   Pulse 87   Ht 5\' 6"  (1.676 m)   Wt 219 lb 9.6 oz (99.6 kg)   SpO2 98%   BMI 35.44 kg/m    Wt Readings from Last 3 Encounters:  06/20/22 219 lb 9.6 oz (99.6 kg)  04/05/22  218 lb 12.8 oz (99.2 kg)  05/05/21 204 lb (92.5 kg)    General: Well nourished, well developed, in no acute distress Head: Atraumatic, normal size  Eyes: PEERLA, EOMI  Neck: Supple, no JVD Endocrine: No thryomegaly Cardiac: Normal S1, S2; RRR; no murmurs, rubs, or gallops Lungs: Clear to auscultation bilaterally, no wheezing, rhonchi or rales  Abd: Soft, nontender, no hepatomegaly  Ext: No edema, pulses 2+ Musculoskeletal: No deformities, BUE and BLE strength normal and equal Skin: Warm and dry, no rashes   Neuro: Alert and oriented to person, place, time, and situation, CNII-XII grossly intact, no focal deficits  Psych: Normal mood and affect   ASSESSMENT:   Sarah Shaw is a 47 y.o. female who presents for the following: 1. SVT (supraventricular tachycardia)   2. Tachycardia     PLAN:   1. SVT (supraventricular tachycardia) 2. Tachycardia -Brief SVT on monitor.  No further symptoms.  Echo normal.  We will plan for watchful waiting.  No medications indicated.  She will reach back out if symptoms recur.  No treatment is needed at this time.  Disposition: Return if symptoms worsen or fail to improve.  Medication Adjustments/Labs and Tests Ordered: Current medicines are reviewed at length with the patient today.  Concerns regarding medicines are outlined above.  No orders of the defined types were placed in this encounter.  No orders of the defined types were placed in this encounter.   Patient Instructions  Medication Instructions:  The current medical regimen is effective;  continue present plan and medications.  *If you need a refill on your cardiac medications before your next appointment, please call your pharmacy*   Follow-Up: At [facility], you and your health needs are our priority.  As part of our continuing mission to provide you with exceptional heart care, we have created designated Provider Care Teams.  These Care Teams include your primary  Cardiologist (physician) and Advanced Practice Providers (APPs -  Physician Assistants and Nurse Practitioners) who all work together to provide you with the care you need, when you need it.  We recommend signing up for the patient portal called "MyChart".  Sign up information is provided on this After Visit Summary.  MyChart is used to connect with patients for Virtual Visits (Telemedicine).  Patients are able to view lab/test results, encounter notes, upcoming appointments, etc.  Non-urgent messages can be sent to your provider as well.   To learn more about what you can do with MyChart, go to [email or web address].    Your next appointment:   As needed  Provider:   Lennie Odor, MD  Time Spent with Patient: I have spent a total of 25 minutes with patient reviewing hospital notes, telemetry, EKGs, labs and examining the patient as well as establishing an assessment and plan that was discussed with the patient.  > 50% of time was spent in direct patient care.  Signed, Lenna Gilford. Flora Lipps, MD, [facility]  [facility]  [address] [place], [place] [address] [phone]  06/20/2022 3:30 PM

## 2022-06-20 ENCOUNTER — Encounter: Payer: Self-pay | Admitting: Cardiovascular Disease

## 2022-06-20 ENCOUNTER — Ambulatory Visit: Payer: Commercial Managed Care - PPO | Attending: Cardiovascular Disease | Admitting: Cardiovascular Disease

## 2022-06-20 VITALS — BP 124/80 | HR 87 | Ht 66.0 in | Wt 219.6 lb

## 2022-06-20 DIAGNOSIS — I471 Supraventricular tachycardia, unspecified: Secondary | ICD-10-CM | POA: Diagnosis not present

## 2022-06-20 DIAGNOSIS — R000 Tachycardia, unspecified: Secondary | ICD-10-CM

## 2022-06-20 NOTE — Patient Instructions (Signed)
Medication Instructions:  The current medical regimen is effective;  continue present plan and medications.  *If you need a refill on your cardiac medications before your next appointment, please call your pharmacy*   Follow-Up: At [facility], you and your health needs are our priority.  As part of our continuing mission to provide you with exceptional heart care, we have created designated Provider Care Teams.  These Care Teams include your primary Cardiologist (physician) and Advanced Practice Providers (APPs -  Physician Assistants and Nurse Practitioners) who all work together to provide you with the care you need, when you need it.  We recommend signing up for the patient portal called "MyChart".  Sign up information is provided on this After Visit Summary.  MyChart is used to connect with patients for Virtual Visits (Telemedicine).  Patients are able to view lab/test results, encounter notes, upcoming appointments, etc.  Non-urgent messages can be sent to your provider as well.   To learn more about what you can do with MyChart, go to [email or web address].    Your next appointment:   As needed  Provider:   Claiborne O'Neal, MD   

## 2022-06-27 ENCOUNTER — Other Ambulatory Visit: Payer: Self-pay

## 2022-08-09 ENCOUNTER — Encounter: Payer: Self-pay | Admitting: Nurse Practitioner

## 2022-08-09 ENCOUNTER — Ambulatory Visit (INDEPENDENT_AMBULATORY_CARE_PROVIDER_SITE_OTHER): Payer: Commercial Managed Care - PPO | Admitting: Nurse Practitioner

## 2022-08-09 VITALS — BP 110/60 | HR 77 | Temp 98.0°F | Ht 66.0 in | Wt 219.0 lb

## 2022-08-09 DIAGNOSIS — E559 Vitamin D deficiency, unspecified: Secondary | ICD-10-CM | POA: Diagnosis not present

## 2022-08-09 DIAGNOSIS — E049 Nontoxic goiter, unspecified: Secondary | ICD-10-CM | POA: Diagnosis not present

## 2022-08-09 DIAGNOSIS — Z6835 Body mass index (BMI) 35.0-35.9, adult: Secondary | ICD-10-CM | POA: Diagnosis not present

## 2022-08-09 DIAGNOSIS — R5383 Other fatigue: Secondary | ICD-10-CM | POA: Diagnosis not present

## 2022-08-09 DIAGNOSIS — R7303 Prediabetes: Secondary | ICD-10-CM

## 2022-08-09 DIAGNOSIS — Z0000 Encounter for general adult medical examination without abnormal findings: Secondary | ICD-10-CM | POA: Diagnosis not present

## 2022-08-09 DIAGNOSIS — Z2821 Immunization not carried out because of patient refusal: Secondary | ICD-10-CM

## 2022-08-09 DIAGNOSIS — E041 Nontoxic single thyroid nodule: Secondary | ICD-10-CM | POA: Insufficient documentation

## 2022-08-09 DIAGNOSIS — E6609 Other obesity due to excess calories: Secondary | ICD-10-CM | POA: Diagnosis not present

## 2022-08-09 DIAGNOSIS — Z13220 Encounter for screening for lipoid disorders: Secondary | ICD-10-CM | POA: Diagnosis not present

## 2022-08-09 DIAGNOSIS — D508 Other iron deficiency anemias: Secondary | ICD-10-CM

## 2022-08-09 NOTE — Progress Notes (Signed)
Madelaine Bhat, CMA,acting as a Neurosurgeon for Arnette Felts, FNP.,have documented all relevant documentation on the behalf of Arnette Felts, FNP,as directed by  Arnette Felts, FNP while in the presence of Arnette Felts, FNP.  Subjective:    Patient ID: Sarah Shaw , female    DOB: Nov 09, 1975 , 47 y.o.   MRN: [id number]  Chief Complaint  Patient presents with   Annual Exam    HPI  Patient presents today for HM, Patient reports compliance with medications. Patient has no other concerns today, patient denies any chest pain, SOB, or headaches.   BP Readings from Last 3 Encounters: 08/09/22 : 110/60 06/20/22 : 124/80 04/05/22 : 122/82       Past Medical History:  Diagnosis Date   Anemia      Family History  Problem Relation Age of Onset   Healthy Mother    Cancer Father      Current Outpatient Medications:    cyanocobalamin 100 MCG tablet, Take 100 mcg by mouth daily., Disp: , Rfl:    ferrous sulfate 325 (65 FE) MG tablet, Take 325 mg by mouth daily with breakfast., Disp: , Rfl:    Vitamin D, Ergocalciferol, (DRISDOL) 1.25 MG (50000 UNIT) CAPS capsule, Take 1 capsule (50,000 Units total) by mouth every 7 (seven) days., Disp: 12 capsule, Rfl: 1   No Known Allergies    The patient states she uses vasectomy for birth control. Patient's last menstrual period was 07/27/2022.. Negative for Dysmenorrhea and Negative for Menorrhagia. Negative for: breast discharge, breast lump(s), breast pain and breast self exam. Associated symptoms include abnormal vaginal bleeding. Pertinent negatives include abnormal bleeding (hematology), anxiety, decreased libido, depression, difficulty falling sleep, dyspareunia, history of infertility, nocturia, sexual dysfunction, sleep disturbances, urinary incontinence, urinary urgency, vaginal discharge and vaginal itching. Diet regular; trying to cut back. The patient states her exercise level is moderate with walking 30 minutes 4 days a week.   The  patient's tobacco use is:  Social History   Tobacco Use  Smoking Status Never  Smokeless Tobacco Never   She has been exposed to passive smoke. The patient's alcohol use is:  Social History   Substance and Sexual Activity  Alcohol Use Never   Additional information: Last pap unknown will send letter to GYN Review of Systems  Constitutional:  Positive for fatigue.  HENT: Negative.    Eyes: Negative.   Respiratory: Negative.    Cardiovascular: Negative.   Gastrointestinal: Negative.   Endocrine: Negative.   Genitourinary: Negative.   Musculoskeletal: Negative.   Skin: Negative.   Allergic/Immunologic: Negative.   Neurological: Negative.   Hematological: Negative.   Psychiatric/Behavioral: Negative.       Today's Vitals   08/09/22 1532  BP: 110/60  Pulse: 77  Temp: 98 F (36.7 C)  TempSrc: Oral  Weight: 219 lb (99.3 kg)  Height: 5\' 6"  (1.676 m)  PainSc: 0-No pain   Body mass index is 35.35 kg/m.  Wt Readings from Last 3 Encounters:  08/09/22 219 lb (99.3 kg)  06/20/22 219 lb 9.6 oz (99.6 kg)  04/05/22 218 lb 12.8 oz (99.2 kg)     Objective:  Physical Exam Vitals reviewed.  Constitutional:      General: She is not in acute distress.    Appearance: Normal appearance. She is well-developed. She is obese.  HENT:     Head: Normocephalic and atraumatic.     Right Ear: Hearing, tympanic membrane, ear canal and external ear normal. There is no impacted cerumen.  Left Ear: Hearing, tympanic membrane, ear canal and external ear normal. There is no impacted cerumen.     Nose: Nose normal.     Mouth/Throat:     Mouth: Mucous membranes are moist.  Eyes:     General: Lids are normal.     Extraocular Movements: Extraocular movements intact.     Conjunctiva/sclera: Conjunctivae normal.     Pupils: Pupils are equal, round, and reactive to light.     Funduscopic exam:    Right eye: No papilledema.        Left eye: No papilledema.  Neck:     Thyroid: Thyromegaly  present. No thyroid mass.     Vascular: No carotid bruit.  Cardiovascular:     Rate and Rhythm: Normal rate and regular rhythm.     Pulses: Normal pulses.     Heart sounds: Normal heart sounds. No murmur heard. Pulmonary:     Effort: Pulmonary effort is normal. No respiratory distress.     Breath sounds: Normal breath sounds. No wheezing.  Abdominal:     General: Abdomen is flat. Bowel sounds are normal. There is no distension.     Palpations: Abdomen is soft.     Tenderness: There is no abdominal tenderness.  Genitourinary:    Comments: Deferred followed by GYN Musculoskeletal:        General: No swelling. Normal range of motion.     Cervical back: Full passive range of motion without pain, normal range of motion and neck supple. No rigidity.     Right lower leg: No edema.     Left lower leg: No edema.  Skin:    General: Skin is warm and dry.     Capillary Refill: Capillary refill takes less than 2 seconds.  Neurological:     General: No focal deficit present.     Mental Status: She is alert and oriented to person, place, and time.     Cranial Nerves: No cranial nerve deficit.     Sensory: No sensory deficit.     Motor: No weakness.  Psychiatric:        Mood and Affect: Mood normal.        Behavior: Behavior normal.        Thought Content: Thought content normal.        Judgment: Judgment normal.         Assessment And Plan:     Annual physical exam Assessment & Plan: Behavior modifications discussed and diet history reviewed.   Pt will continue to exercise regularly and modify diet with low GI, plant based foods and decrease intake of processed foods.  Recommend intake of daily multivitamin, Vitamin D, and calcium.  Recommend mammogram and colonoscopy for preventive screenings, as well as recommend immunizations that include influenza, TDAP    Class 2 obesity due to excess calories without serious comorbidity with body mass index (BMI) of 35.0 to 35.9 in  adult Assessment & Plan: She is encouraged to strive for BMI less than 30 to decrease cardiac risk.  Advised to aim for at least 150 minutes of exercise per week. Encouraged to park further when at the store, take stairs instead of elevators and to walk in place during commercials. Increase water intake to at least one gallon of water daily.    Prediabetes Assessment & Plan: No current medications, encouraged to exercise regularly and eat a healthy diet low in sugar and starches.  Orders: -     CMP14+EGFR -  Hemoglobin A1c  Goiter Assessment & Plan: Will f/u on thyroid ultrasound for any changes  Orders: -     US THYROID; Future  Vitamin D deficiency Assessment & Plan: Will check vitamin D level and supplement as needed.    Also encouraged to spend 15 minutes in the sun daily.    Orders: -     VITAMIN D 25 Hydroxy (Vit-D Deficiency, Fractures)  Thyroid nodule Assessment & Plan: Will recheck thyroid ultrasound  Orders: -     US THYROID; Future  Other fatigue Assessment & Plan: Will check metabolic causes. If normal and has not had a sleep study will send for one  Orders: -     TSH -     Vitamin B12  Iron deficiency anemia secondary to inadequate dietary iron intake Assessment & Plan: Will check iron studies, she is currently taking an iron supplement  Orders: -     CBC with Differential/Platelet -     Iron, TIBC and Ferritin Panel  Encounter for screening for lipid disorder -     Lipid panel  COVID-19 vaccination declined Assessment & Plan: Declines covid 19 vaccine. Discussed risk of covid 31 and if she changes her mind about the vaccine to call the office. Education has been provided regarding the importance of this vaccine but patient still declined. Advised may receive this vaccine at local pharmacy or Health Dept.or vaccine clinic. Aware to provide a copy of the vaccination record if obtained from local pharmacy or Health Dept.  Encouraged to take  multivitamin, vitamin d, vitamin c and zinc to increase immune system. Aware can call office if would like to have vaccine here at office. Verbalized acceptance and understanding.       Return for 1 year physical, controlled DM check 4 months. Patient was given opportunity to ask questions. Patient verbalized understanding of the plan and was able to repeat key elements of the plan. All questions were answered to their satisfaction.   Arnette Felts, FNP  I, Arnette Felts, FNP, have reviewed all documentation for this visit. The documentation on 08/09/22 for the exam, diagnosis, procedures, and orders are all accurate and complete.

## 2022-08-10 LAB — CBC WITH DIFFERENTIAL/PLATELET
Basophils Absolute: 0 10*3/uL (ref 0.0–0.2)
Basos: 0 %
EOS (ABSOLUTE): 0.2 10*3/uL (ref 0.0–0.4)
Eos: 2 %
Hematocrit: 34.2 % (ref 34.0–46.6)
Hemoglobin: 10.8 g/dL — ABNORMAL LOW (ref 11.1–15.9)
Immature Grans (Abs): 0 10*3/uL (ref 0.0–0.1)
Immature Granulocytes: 0 %
Lymphocytes Absolute: 2.1 10*3/uL (ref 0.7–3.1)
Lymphs: 27 %
MCH: 24.6 pg — ABNORMAL LOW (ref 26.6–33.0)
MCHC: 31.6 g/dL (ref 31.5–35.7)
MCV: 78 fL — ABNORMAL LOW (ref 79–97)
Monocytes Absolute: 0.5 10*3/uL (ref 0.1–0.9)
Monocytes: 6 %
Neutrophils Absolute: 5 10*3/uL (ref 1.4–7.0)
Neutrophils: 65 %
Platelets: 283 10*3/uL (ref 150–450)
RBC: 4.39 x10E6/uL (ref 3.77–5.28)
RDW: 14.4 % (ref 11.7–15.4)
WBC: 7.8 10*3/uL (ref 3.4–10.8)

## 2022-08-10 LAB — HEMOGLOBIN A1C
Est. average glucose Bld gHb Est-mCnc: 123 mg/dL
Hgb A1c MFr Bld: 5.9 % — ABNORMAL HIGH (ref 4.8–5.6)

## 2022-08-10 LAB — CMP14+EGFR
ALT: 15 IU/L (ref 0–32)
AST: 17 IU/L (ref 0–40)
Albumin: 4.2 g/dL (ref 3.9–4.9)
Alkaline Phosphatase: 52 IU/L (ref 44–121)
BUN/Creatinine Ratio: 12 (ref 9–23)
BUN: 10 mg/dL (ref 6–24)
Bilirubin Total: 0.4 mg/dL (ref 0.0–1.2)
CO2: 20 mmol/L (ref 20–29)
Calcium: 9.1 mg/dL (ref 8.7–10.2)
Chloride: 101 mmol/L (ref 96–106)
Creatinine, Ser: 0.84 mg/dL (ref 0.57–1.00)
Globulin, Total: 2.5 g/dL (ref 1.5–4.5)
Glucose: 85 mg/dL (ref 70–99)
Potassium: 4.2 mmol/L (ref 3.5–5.2)
Sodium: 135 mmol/L (ref 134–144)
Total Protein: 6.7 g/dL (ref 6.0–8.5)
eGFR: 86 mL/min/{1.73_m2} (ref >59)

## 2022-08-10 LAB — LIPID PANEL
Chol/HDL Ratio: 3.8 ratio (ref 0.0–4.4)
Cholesterol, Total: 182 mg/dL (ref 100–199)
HDL: 48 mg/dL (ref >39)
LDL Chol Calc (NIH): 120 mg/dL — ABNORMAL HIGH (ref 0–99)
Triglycerides: 74 mg/dL (ref 0–149)
VLDL Cholesterol Cal: 14 mg/dL (ref 5–40)

## 2022-08-10 LAB — TSH: TSH: 1.73 u[IU]/mL (ref 0.450–4.500)

## 2022-08-10 LAB — IRON,TIBC AND FERRITIN PANEL
Ferritin: 8 ng/mL — ABNORMAL LOW (ref 15–150)
Iron Saturation: 7 % — CL (ref 15–55)
Iron: 30 ug/dL (ref 27–159)
Total Iron Binding Capacity: 416 ug/dL (ref 250–450)
UIBC: 386 ug/dL (ref 131–425)

## 2022-08-10 LAB — VITAMIN D 25 HYDROXY (VIT D DEFICIENCY, FRACTURES): Vit D, 25-Hydroxy: 34.9 ng/mL (ref 30.0–100.0)

## 2022-08-10 LAB — VITAMIN B12: Vitamin B-12: 401 pg/mL (ref 232–1245)

## 2022-08-17 ENCOUNTER — Ambulatory Visit
Admission: RE | Admit: 2022-08-17 | Discharge: 2022-08-17 | Disposition: A | Discharge status: Home / Self Care | Payer: Commercial Managed Care - PPO | Source: Ambulatory Visit | Attending: Nurse Practitioner | Admitting: Nurse Practitioner

## 2022-08-17 DIAGNOSIS — E041 Nontoxic single thyroid nodule: Secondary | ICD-10-CM

## 2022-08-17 DIAGNOSIS — E049 Nontoxic goiter, unspecified: Secondary | ICD-10-CM

## 2022-08-30 NOTE — Assessment & Plan Note (Signed)

## 2022-08-30 NOTE — Assessment & Plan Note (Signed)

## 2022-08-30 NOTE — Assessment & Plan Note (Signed)
Will check metabolic causes. If normal and has not had a sleep study will send for one

## 2022-08-30 NOTE — Assessment & Plan Note (Signed)
No current medications, encouraged to exercise regularly and eat a healthy diet low in sugar and starches.

## 2022-08-30 NOTE — Assessment & Plan Note (Signed)
Will check vitamin D level and supplement as needed.    Also encouraged to spend 15 minutes in the sun daily.   

## 2022-08-30 NOTE — Assessment & Plan Note (Signed)
She is encouraged to strive for BMI less than 30 to decrease cardiac risk. Advised to aim for at least 150 minutes of exercise per week.  Encouraged to park further when at the store, take stairs instead of elevators and to walk in place during commercials. Increase water intake to at least one gallon of water daily.  

## 2022-08-30 NOTE — Assessment & Plan Note (Signed)
Will f/u on thyroid ultrasound for any changes

## 2022-08-30 NOTE — Assessment & Plan Note (Signed)
Will check iron studies, she is currently taking an iron supplement

## 2022-09-01 NOTE — Assessment & Plan Note (Signed)
Will recheck thyroid ultrasound

## 2022-09-25 ENCOUNTER — Other Ambulatory Visit: Payer: Self-pay | Admitting: Nurse Practitioner

## 2022-09-26 ENCOUNTER — Other Ambulatory Visit (HOSPITAL_COMMUNITY): Payer: Self-pay

## 2022-09-26 MED ORDER — VITAMIN D (ERGOCALCIFEROL) 1.25 MG (50000 UNIT) PO CAPS
50000.0000 [IU] | ORAL_CAPSULE | ORAL | 1 refills | Status: DC
  Filled 2022-09-26: qty 12, 84d supply, fill #0
  Filled 2022-12-18: qty 12, 84d supply, fill #1

## 2022-12-14 ENCOUNTER — Ambulatory Visit: Payer: Commercial Managed Care - PPO | Admitting: Nurse Practitioner

## 2022-12-18 ENCOUNTER — Other Ambulatory Visit (HOSPITAL_COMMUNITY): Payer: Self-pay

## 2022-12-18 ENCOUNTER — Ambulatory Visit: Payer: Commercial Managed Care - PPO | Admitting: Nurse Practitioner

## 2022-12-18 ENCOUNTER — Encounter: Payer: Self-pay | Admitting: Nurse Practitioner

## 2022-12-18 VITALS — BP 122/80 | HR 91 | Temp 98.0°F | Ht 66.0 in | Wt 214.8 lb

## 2022-12-18 DIAGNOSIS — E559 Vitamin D deficiency, unspecified: Secondary | ICD-10-CM | POA: Diagnosis not present

## 2022-12-18 DIAGNOSIS — D508 Other iron deficiency anemias: Secondary | ICD-10-CM | POA: Diagnosis not present

## 2022-12-18 DIAGNOSIS — E66811 Obesity, class 1: Secondary | ICD-10-CM | POA: Diagnosis not present

## 2022-12-18 DIAGNOSIS — R7303 Prediabetes: Secondary | ICD-10-CM | POA: Diagnosis not present

## 2022-12-18 MED ORDER — VITAMIN D (ERGOCALCIFEROL) 1.25 MG (50000 UNIT) PO CAPS
50000.0000 [IU] | ORAL_CAPSULE | ORAL | 1 refills | Status: DC
Start: 2022-12-18 — End: 2023-06-09
  Filled 2022-12-18: qty 12, 84d supply, fill #0
  Filled 2023-03-19: qty 12, 84d supply, fill #1

## 2022-12-18 NOTE — Assessment & Plan Note (Signed)
She is encouraged to strive for BMI less than 30 to decrease cardiac risk.  Continue exercising regularly ] Continue with healthy diet Increase water intake to at least one gallon of water daily. Congratulated on her 5 lb weight loss since her last visit

## 2022-12-18 NOTE — Assessment & Plan Note (Signed)
Continue supplement weekly  Also encouraged to spend 15 minutes in the sun daily.

## 2022-12-18 NOTE — Progress Notes (Signed)
Madelaine Bhat, CMA,acting as a Neurosurgeon for Arnette Felts, FNP.,have documented all relevant documentation on the behalf of Arnette Felts, FNP,as directed by  Arnette Felts, FNP while in the presence of Arnette Felts, FNP.  Subjective:  Patient ID: Sarah Shaw , female    DOB: 10/21/1975 , 47 y.o.   MRN: [id number]  Chief Complaint  Patient presents with   Prediabetes    HPI  Patient presents today for a pre dm follow up, Patient reports compliance with medication. Patient denies any chest pain, SOB, or headaches. Patient has no concerns today. She is trying to avoid carbs and sweets. She is exercising 5 days a week with a mix of cardio and strength training. She is taking an iron supplement daily.      Past Medical History:  Diagnosis Date   Anemia      Family History  Problem Relation Age of Onset   Healthy Mother    Cancer Father      Current Outpatient Medications:    cyanocobalamin 100 MCG tablet, Take 100 mcg by mouth daily., Disp: , Rfl:    ferrous sulfate 325 (65 FE) MG tablet, Take 325 mg by mouth daily with breakfast., Disp: , Rfl:    Vitamin D, Ergocalciferol, (DRISDOL) 1.25 MG (50000 UNIT) CAPS capsule, Take 1 capsule (50,000 Units total) by mouth every 7 (seven) days., Disp: 12 capsule, Rfl: 1   No Known Allergies   Review of Systems  Constitutional: Negative.   Respiratory: Negative.    Cardiovascular: Negative.   Musculoskeletal: Negative.   Neurological: Negative.   Psychiatric/Behavioral: Negative.       Today's Vitals   12/18/22 1621  BP: 122/80  Pulse: 91  Temp: 98 F (36.7 C)  SpO2: 98%  Weight: 214 lb 12.8 oz (97.4 kg)  Height: 5\' 6"  (1.676 m)   Body mass index is 34.67 kg/m.  Wt Readings from Last 3 Encounters:  12/18/22 214 lb 12.8 oz (97.4 kg)  08/09/22 219 lb (99.3 kg)  06/20/22 219 lb 9.6 oz (99.6 kg)     Objective:  Physical Exam Vitals reviewed.  Constitutional:      General: She is not in acute distress.    Appearance:  Normal appearance. She is obese.  Cardiovascular:     Rate and Rhythm: Normal rate and regular rhythm.     Pulses: Normal pulses.     Heart sounds: Normal heart sounds. No murmur heard. Pulmonary:     Effort: Pulmonary effort is normal. No respiratory distress.     Breath sounds: Normal breath sounds. No wheezing.  Musculoskeletal:     Cervical back: Normal range of motion and neck supple.  Skin:    General: Skin is warm and dry.     Capillary Refill: Capillary refill takes less than 2 seconds.  Neurological:     General: No focal deficit present.     Mental Status: She is alert and oriented to person, place, and time.     Cranial Nerves: No cranial nerve deficit.     Motor: No weakness.  Psychiatric:        Mood and Affect: Mood normal.        Behavior: Behavior normal.        Thought Content: Thought content normal.        Judgment: Judgment normal.         Assessment And Plan:  Prediabetes Assessment & Plan: No current medications, encouraged to exercise regularly and eat a healthy  diet low in sugar and starches.  Orders: -     Hemoglobin A1c  Obesity (BMI 30.0-34.9) Assessment & Plan: She is encouraged to strive for BMI less than 30 to decrease cardiac risk.  Continue exercising regularly ] Continue with healthy diet Increase water intake to at least one gallon of water daily. Congratulated on her 5 lb weight loss since her last visit   Iron deficiency anemia secondary to inadequate dietary iron intake Assessment & Plan: Will check iron studies, she is currently taking an iron supplement.    Orders: -     Iron, TIBC and Ferritin Panel  Vitamin D deficiency Assessment & Plan: Continue supplement weekly  Also encouraged to spend 15 minutes in the sun daily.    Orders: -     Vitamin D (Ergocalciferol); Take 1 capsule (50,000 Units total) by mouth every 7 (seven) days.  Dispense: 12 capsule; Refill: 1  She is encouraged to strive for BMI less than 30 to  decrease cardiac risk. Advised to aim for at least 150 minutes of exercise per week.   Return for controlled DM check 4 months.  Patient was given opportunity to ask questions. Patient verbalized understanding of the plan and was able to repeat key elements of the plan. All questions were answered to their satisfaction.    Jeanell Sparrow, FNP, have reviewed all documentation for this visit. The documentation on 12/18/22 for the exam, diagnosis, procedures, and orders are all accurate and complete.   IF YOU HAVE BEEN REFERRED TO A SPECIALIST, IT MAY TAKE 1-2 WEEKS TO SCHEDULE/PROCESS THE REFERRAL. IF YOU HAVE NOT HEARD FROM US/SPECIALIST IN TWO WEEKS, PLEASE GIVE [place] A CALL AT [phone].

## 2022-12-18 NOTE — Assessment & Plan Note (Signed)
No current medications, encouraged to exercise regularly and eat a healthy diet low in sugar and starches.

## 2022-12-18 NOTE — Assessment & Plan Note (Signed)
Will check iron studies, she is currently taking an iron supplement

## 2022-12-19 LAB — IRON,TIBC AND FERRITIN PANEL
Ferritin: 11 ng/mL — ABNORMAL LOW (ref 15–150)
Iron Saturation: 6 % — CL (ref 15–55)
Iron: 26 ug/dL — ABNORMAL LOW (ref 27–159)
Total Iron Binding Capacity: 451 ug/dL — ABNORMAL HIGH (ref 250–450)
UIBC: 425 ug/dL (ref 131–425)

## 2022-12-19 LAB — HEMOGLOBIN A1C
Est. average glucose Bld gHb Est-mCnc: 123 mg/dL
Hgb A1c MFr Bld: 5.9 % — ABNORMAL HIGH (ref 4.8–5.6)

## 2022-12-20 ENCOUNTER — Other Ambulatory Visit: Payer: Self-pay | Admitting: Nurse Practitioner

## 2022-12-20 DIAGNOSIS — D508 Other iron deficiency anemias: Secondary | ICD-10-CM

## 2022-12-29 NOTE — Progress Notes (Unsigned)
[facility]   [phone] [phone]) [phone]   Clinic New Consult Note   Patient Care Team: Arnette Felts, FNP as PCP - General (General Practice) 12/30/2022  CHIEF COMPLAINTS/PURPOSE OF CONSULTATION:  Iron deficient anemia   REFERRING PHYSICIAN: Arnette Felts, FNP   Discussed the use of AI scribe software for clinical note transcription with the patient, who gave verbal consent to proceed.  History of Present Illness   The patient, a 47 year old female with a history of heavy menstrual periods, presents for a new consult for anemia. The patient reports that her periods are regular, occurring once a month, but are heavy for the first two days, requiring a change of sanitary products every one to two hours. The patient denies any other bleeding, such as blood in the stool, urine, nosebleeds, or gum bleeding.  The patient has a history of a hiatal hernia, diagnosed during an endoscopy three to four years ago, which could slightly impact iron absorption. The patient also has a family history of colon cancer, with her father, uncle, aunt, and both grandparents on her father's side having had the disease. Due to this family history, the patient has been undergoing colonoscopies every five years for screening, the last of which was three years ago and was normal.  The patient has been taking iron supplements, but not regularly due to gastrointestinal side effects. The patient has tried different brands of iron supplements, but all have caused some degree of stomach upset. The patient also takes vitamin D and B12 supplements.  The patient denies any other medical problems, does not smoke or drink alcohol, and has three children. The patient has not had any surgeries in the past.         MEDICAL HISTORY:  Past Medical History:  Diagnosis Date   Anemia     SURGICAL HISTORY: History reviewed. No pertinent surgical history.  SOCIAL HISTORY: Social History    Socioeconomic History   Marital status: Married    Spouse name: Not on file   Number of children: 3   Years of education: Not on file   Highest education level: Bachelor's degree (e.g., BA, AB, BS)  Occupational History   Occupation: Nurse [employer]  Tobacco Use   Smoking status: Never   Smokeless tobacco: Never  Substance and Sexual Activity   Alcohol use: Never   Drug use: Never   Sexual activity: Yes    Birth control/protection: Other-see comments    Comment: Husband had a vasectomy  Other Topics Concern   Not on file  Social History Narrative   Not on file   Social Determinants of Health   Financial Resource Strain: Low Risk  (12/17/2022)   Overall Financial Resource Strain (CARDIA)    Difficulty of Paying Living Expenses: Not hard at all  Food Insecurity: No Food Insecurity (12/30/2022)   Hunger Vital Sign    Worried About Running Out of Food in the Last Year: Never true    Ran Out of Food in the Last Year: Never true  Transportation Needs: No Transportation Needs (12/30/2022)   PRAPARE - Administrator, Civil Service (Medical): No    Lack of Transportation (Non-Medical): No  Physical Activity: Insufficiently Active (12/17/2022)   Exercise Vital Sign    Days of Exercise per Week: 5 days    Minutes of Exercise per Session: 20 min  Stress: No Stress Concern Present (12/17/2022)   [employer] of Occupational Health - Occupational Stress Questionnaire  Feeling of Stress : Not at all  Social Connections: Socially Integrated (12/17/2022)   Social Connection and Isolation Panel [NHANES]    Frequency of Communication with Friends and Family: More than three times a week    Frequency of Social Gatherings with Friends and Family: Once a week    Attends Religious Services: More than 4 times per year    Active Member of [employer] or Organizations: Yes    Attends Engineer, structural: More than 4 times per year    Marital Status: Married   Catering manager Violence: Not At Risk (12/30/2022)   Humiliation, Afraid, Rape, and Kick questionnaire    Fear of Current or Ex-Partner: No    Emotionally Abused: No    Physically Abused: No    Sexually Abused: No    FAMILY HISTORY: Family History  Problem Relation Age of Onset   Healthy Mother    Cancer Father 49   Cancer Paternal Aunt 5       Concol cancer   Cancer Paternal Uncle 53       colon cancer   Cancer Paternal Grandmother        pancreatic cancer   Cancer Paternal Grandfather        multiple myeloma    ALLERGIES:  has No Known Allergies.  MEDICATIONS:  Current Outpatient Medications  Medication Sig Dispense Refill   cyanocobalamin 100 MCG tablet Take 100 mcg by mouth daily.     ferrous sulfate 325 (65 FE) MG tablet Take 325 mg by mouth daily with breakfast.     Vitamin D, Ergocalciferol, (DRISDOL) 1.25 MG (50000 UNIT) CAPS capsule Take 1 capsule (50,000 Units total) by mouth every 7 (seven) days. 12 capsule 1   No current facility-administered medications for this visit.    REVIEW OF SYSTEMS:   Constitutional: Denies fevers, chills or abnormal night sweats Eyes: Denies blurriness of vision, double vision or watery eyes Ears, nose, mouth, throat, and face: Denies mucositis or sore throat Respiratory: Denies cough, dyspnea or wheezes Cardiovascular: Denies palpitation, chest discomfort or lower extremity swelling Gastrointestinal:  Denies nausea, heartburn or change in bowel habits Skin: Denies abnormal skin rashes Lymphatics: Denies new lymphadenopathy or easy bruising Neurological:Denies numbness, tingling or new weaknesses Behavioral/Psych: Mood is stable, no new changes  All other systems were reviewed with the patient and are negative.  PHYSICAL EXAMINATION: ECOG PERFORMANCE STATUS: 0 - Asymptomatic  Vitals:   12/30/22 0809  BP: (!) 146/90  Pulse: 77  Resp: 20  Temp: (!) 97.3 F (36.3 C)  SpO2: 100%   Filed Weights   12/30/22 0809   Weight: 216 lb 6.4 oz (98.2 kg)    GENERAL:alert, no distress and comfortable SKIN: skin color, texture, turgor are normal, no rashes or significant lesions EYES: normal, conjunctiva are pink and non-injected, sclera clear OROPHARYNX:no exudate, no erythema and lips, buccal mucosa, and tongue normal  NECK: supple, thyroid normal size, non-tender, without nodularity LYMPH:  no palpable lymphadenopathy in the cervical, axillary or inguinal LUNGS: clear to auscultation and percussion with normal breathing effort HEART: regular rate & rhythm and no murmurs and no lower extremity edema ABDOMEN:abdomen soft, non-tender and normal bowel sounds Musculoskeletal:no cyanosis of digits and no clubbing  PSYCH: alert & oriented x 3 with fluent speech NEURO: no focal motor/sensory deficits  Physical Exam   ABDOMEN: No tenderness on palpation. Bowels regular.      LABORATORY DATA:  I have reviewed the data as listed    Latest Ref  Rng & Units 08/09/2022    4:19 PM 08/11/2020    4:31 PM 04/20/2020    9:48 AM  CBC  WBC 3.4 - 10.8 x10E3/uL 7.8  8.4  7.3   Hemoglobin 11.1 - 15.9 g/dL 16.1  09.6  04.5   Hematocrit 34.0 - 46.6 % 34.2  35.3  32.8   Platelets 150 - 450 x10E3/uL 283  241  368       Latest Ref Rng & Units 08/09/2022    4:19 PM 05/05/2021   12:36 PM 10/27/2020    9:39 AM  CMP  Glucose 70 - 99 mg/dL 85  92  91   BUN 6 - 24 mg/dL 10  10  11    Creatinine 0.57 - 1.00 mg/dL 4.09  8.11  9.14   Sodium 134 - 144 mmol/L 135  138  141   Potassium 3.5 - 5.2 mmol/L 4.2  4.3  4.4   Chloride 96 - 106 mmol/L 101  104  104   CO2 20 - 29 mmol/L 20  23  22    Calcium 8.7 - 10.2 mg/dL 9.1  9.6  9.5   Total Protein 6.0 - 8.5 g/dL 6.7   7.2   Total Bilirubin 0.0 - 1.2 mg/dL 0.4   0.3   Alkaline Phos 44 - 121 IU/L 52   58   AST 0 - 40 IU/L 17   13   ALT 0 - 32 IU/L 15   11      RADIOGRAPHIC STUDIES: I have personally reviewed the radiological images as listed and agreed with the findings in the  report. No results found.  ASSESSMENT & PLAN:  47 yo female     Iron Deficiency Anemia Iron deficiency anemia likely secondary to menorrhagia. Reports heavy menstrual periods, changing pads every 1-2 hours for the first two days. Previous endoscopy showed esophagitis and a medium-sized hiatal hernia, which may slightly impact iron absorption. Colonoscopy three years ago was normal. Anemia is mild, with occasional fatigue, especially around the menstrual cycle. Discussed iron absorption with vitamin C and potential side effects of oral iron supplements, including GI upset and constipation. Discussed IV iron if oral iron is not tolerated, with a 10-20% chance of mild allergic reactions and rare severe reactions. Discussed alternative iron supplements and benefits of prenatal vitamins with iron, B12, and folate. - Prescribe a new oral iron Accrufer with fewer GI side effects - Advise taking iron with vitamin C or orange juice after meals - Consider prenatal vitamins with iron, B12, and folate - Repeat blood counts in three months - If anemia persists, consider IV iron infusion - Refer to gynecologist to discuss options to manage heavy menstrual bleeding, including ablation or oral contraceptives  Family History of Colon Cancer Strong family history of colon cancer on the paternal side, including father, aunt, uncle, and grandparents. Undergoes regular colonoscopies every five years due to this history. Discussed potential benefits of genetic testing. - Continue regular colonoscopy screenings every five years - Refer to genetics for potential genetic testing  General Health Maintenance Generally healthy with no hypertension, diabetes, or other significant conditions. Does not smoke, drink alcohol, or use recreational drugs. Up to date with mammograms and Pap smears. - Continue annual mammograms and Pap smears - Maintain a balanced diet including meat for better iron absorption  Follow-up -  Schedule follow-up appointment in three months for repeat blood counts - Phone visit a few days after lab results to discuss further management.  -I  called in Accrufer for her         Orders Placed This Encounter  Procedures   Ambulatory referral to Genetics    Referral Priority:   Routine    Referral Type:   Consultation    Referral Reason:   Specialty Services Required    Number of Visits Requested:   1    All questions were answered. The patient knows to call the clinic with any problems, questions or concerns. I spent 25 minutes counseling the patient face to face. The total time spent in the appointment was 30 minutes and more than 50% was on counseling.     Malachy Mood, MD 12/30/2022 8:33 AM

## 2022-12-30 ENCOUNTER — Encounter: Payer: Self-pay | Admitting: Hematology

## 2022-12-30 ENCOUNTER — Other Ambulatory Visit: Payer: Commercial Managed Care - PPO

## 2022-12-30 ENCOUNTER — Inpatient Hospital Stay: Payer: Commercial Managed Care - PPO | Attending: Hematology | Admitting: Hematology

## 2022-12-30 VITALS — BP 146/90 | HR 77 | Temp 97.3°F | Resp 20 | Ht 66.0 in | Wt 216.4 lb

## 2022-12-30 DIAGNOSIS — N920 Excessive and frequent menstruation with regular cycle: Secondary | ICD-10-CM | POA: Diagnosis not present

## 2022-12-30 DIAGNOSIS — Z809 Family history of malignant neoplasm, unspecified: Secondary | ICD-10-CM

## 2022-12-30 DIAGNOSIS — Z800 Family history of malignant neoplasm of digestive organs: Secondary | ICD-10-CM | POA: Insufficient documentation

## 2022-12-30 DIAGNOSIS — Z79899 Other long term (current) drug therapy: Secondary | ICD-10-CM | POA: Diagnosis not present

## 2022-12-30 DIAGNOSIS — D509 Iron deficiency anemia, unspecified: Secondary | ICD-10-CM | POA: Diagnosis not present

## 2022-12-30 MED ORDER — ACCRUFER 30 MG PO CAPS
30.0000 mg | ORAL_CAPSULE | Freq: Every day | ORAL | 3 refills | Status: DC
  Filled 2022-12-30: qty 60, 60d supply, fill #0

## 2022-12-31 ENCOUNTER — Other Ambulatory Visit: Payer: Self-pay

## 2022-12-31 ENCOUNTER — Other Ambulatory Visit (HOSPITAL_COMMUNITY): Payer: Self-pay

## 2023-01-01 ENCOUNTER — Encounter (HOSPITAL_COMMUNITY): Payer: Self-pay

## 2023-01-01 ENCOUNTER — Other Ambulatory Visit: Payer: Self-pay | Admitting: Hematology

## 2023-01-01 ENCOUNTER — Other Ambulatory Visit (HOSPITAL_COMMUNITY): Payer: Self-pay

## 2023-01-01 ENCOUNTER — Other Ambulatory Visit: Payer: Self-pay

## 2023-01-18 DIAGNOSIS — H52223 Regular astigmatism, bilateral: Secondary | ICD-10-CM | POA: Diagnosis not present

## 2023-01-22 ENCOUNTER — Ambulatory Visit (INDEPENDENT_AMBULATORY_CARE_PROVIDER_SITE_OTHER): Payer: Commercial Managed Care - PPO

## 2023-01-22 ENCOUNTER — Encounter: Payer: Self-pay | Admitting: Podiatry

## 2023-01-22 ENCOUNTER — Ambulatory Visit: Payer: Commercial Managed Care - PPO | Admitting: Podiatry

## 2023-01-22 DIAGNOSIS — M2141 Flat foot [pes planus] (acquired), right foot: Secondary | ICD-10-CM | POA: Diagnosis not present

## 2023-01-22 DIAGNOSIS — M779 Enthesopathy, unspecified: Secondary | ICD-10-CM | POA: Diagnosis not present

## 2023-01-22 DIAGNOSIS — M76821 Posterior tibial tendinitis, right leg: Secondary | ICD-10-CM | POA: Diagnosis not present

## 2023-01-22 DIAGNOSIS — M2142 Flat foot [pes planus] (acquired), left foot: Secondary | ICD-10-CM

## 2023-01-22 NOTE — Patient Instructions (Signed)
Posterior Tibial Tendinitis  Posterior tibial tendinitis is irritation of a tendon called the posterior tibial tendon. Your posterior tibial tendon is a cord-like tissue that connects bones of your lower leg and foot to a muscle that: Supports your arch. Helps you raise up on your toes. Helps you turn your foot down and in. This condition causes foot and ankle pain. It can also lead to a flat foot. What are the causes? This condition is most often caused by repeated stress to the tendon (overuse injury). It can also be caused by a sudden injury that stresses the tendon, such as landing on your foot after jumping or falling. What increases the risk? This condition is more likely to develop in: People who play a sport that involves putting a lot of pressure on the feet, such as: Basketball. Tennis. Soccer. Hockey. Runners. Females who are older than 48 years of age and are overweight. People with diabetes. People with decreased foot stability. People with flat feet. What are the signs or symptoms? Symptoms include: Pain in the inner ankle. Pain at the arch of your foot. Pain that gets worse with running, walking, or standing. Swelling on the inside of your ankle and foot. Weakness in your ankle or foot. Inability to stand up on tiptoe. Flattening of the arch of your foot. How is this diagnosed? This condition may be diagnosed based on: Your symptoms. Your medical history. A physical exam. Tests, such as: X-ray. MRI. Ultrasound. How is this treated? This condition may be treated by: Putting ice to the injured area. Taking NSAIDs, such as ibuprofen, to reduce pain and swelling. Wearing a special shoe or shoe insert to support your arch (orthotic). Having physical therapy. Replacing high-impact exercise with low-impact exercise, such as swimming or cycling. If your symptoms do not improve with these treatments, you may need to wear a splint, removable walking boot, or short  leg cast for 6-8 weeks to keep your foot and ankle still (immobilized). Follow these instructions at home: If you have a cast, splint, or boot: Keep it clean and dry. Check the skin around it every day. Tell your health care provider about any concerns. If you have a cast: Do not stick anything inside it to scratch your skin. Doing that increases your risk of infection. You may put lotion on dry skin around the edges of the cast. Do not put lotion on the skin underneath the cast. If you have a splint or boot: Wear it as told by your health care provider. Remove it only as told by your health care provider. Loosen it if your toes tingle, become numb, or turn cold and blue. Bathing Do not take baths, swim, or use a hot tub until your health care provider approves. Ask your health care provider if you may take showers. If your cast, splint, or boot is not waterproof: Do not let it get wet. Cover it with a waterproof covering while you take a bath or a shower. Managing pain and swelling   If directed, put ice on the injured area. If you have a removable splint or boot, remove it as told by your health care provider. Put ice in a plastic bag. Place a towel between your skin and the bag or between your cast and the bag. Leave the ice on for 20 minutes, 2-3 times a day. Move your toes often to reduce stiffness and swelling. Raise (elevate) the injured area above the level of your heart while you are sitting   or lying down. Activity Do not use the injured foot to support your body weight until your health care provider says that you can. Use crutches as told by your health care provider. Do not do activities that make pain or swelling worse. Ask your health care provider when it is safe to drive if you have a cast, splint, or boot on your foot. Return to your normal activities as told by your health care provider. Ask your health care provider what activities are safe for you. Do exercises as  told by your health care provider. General instructions Take over-the-counter and prescription medicines only as told by your health care provider. If you have an orthotic, use it as told by your health care provider. Keep all follow-up visits as told by your health care provider. This is important. How is this prevented? Wear footwear that is appropriate to your athletic activity. Avoid athletic activities that cause pain or swelling in your ankle or foot. Before being active, do range-of-motion and stretching exercises. If you develop pain or swelling while training, stop training. If you have pain or swelling that does not improve after a few days of rest, see your health care provider. If you start a new athletic activity, start gradually so you can build up your strength and flexibility. Contact a health care provider if: Your symptoms get worse. Your symptoms do not improve in 6-8 weeks. You develop new, unexplained symptoms. Your splint, boot, or cast gets damaged. Summary Posterior tibial tendinitis is irritation of a tendon called the posterior tibial tendon. This condition is most often caused by repeated stress to the tendon (overuse injury). This condition causes foot pain and ankle pain. It can also lead to a flat foot. This condition may be treated by not doing high-impact activities, applying ice, having physical therapy, wearing orthotics, and wearing a cast, splint, or boot if needed. This information is not intended to replace advice given to you by your health care provider. Make sure you discuss any questions you have with your health care provider. Document Revised: 05/21/2018 Document Reviewed: 03/28/2018 Elsevier Patient Education  2020 [employer]  Posterior Tibial Tendinitis Rehab Ask your health care provider which exercises are safe for you. Do exercises exactly as told by your health care provider and adjust them as directed. It is normal to feel mild  stretching, pulling, tightness, or discomfort as you do these exercises. Stop right away if you feel sudden pain or your pain gets worse. Do not begin these exercises until told by your health care provider. Stretching and range-of-motion exercises These exercises warm up your muscles and joints and improve the movement and flexibility in your ankle and foot. These exercises may also help to relieve pain. Standing wall calf stretch, knee straight   Stand with your hands against a wall. Extend your left / right leg behind you, and bend your front knee slightly. If directed, place a folded washcloth under the arch of your foot for support. Point the toes of your back foot slightly inward. Keeping your heels on the floor and your back knee straight, shift your weight toward the wall. Do not allow your back to arch. You should feel a gentle stretch in your upper left / right calf. Hold this position for 10 seconds. Repeat 10 times. Complete this exercise 2 times a day. Standing wall calf stretch, knee bent Stand with your hands against a wall. Extend your left / right leg behind you, and bend your front   knee slightly. If directed, place a folded washcloth under the arch of your foot for support. Point the toes of your back foot slightly inward. Unlock your back knee so it is bent. Keep your heels on the floor. You should feel a gentle stretch deep in your lower left / right calf. Hold this position for 10 seconds. Repeat 10 times. Complete this exercise 2 times a day. Strengthening exercises These exercises build strength and endurance in your ankle and foot. Endurance is the ability to use your muscles for a long time, even after they get tired. Ankle inversion with band Secure one end of a rubber exercise band or tubing to a fixed object, such as a table leg or a pole, that will stay still when the band is pulled. Loop the other end of the band around the middle of your left / right foot. Sit  on the floor facing the object with your left / right leg extended. The band or tube should be slightly tense when your foot is relaxed. Leading with your big toe, slowly bring your left / right foot and ankle inward, toward your other foot (inversion). Hold this position for 10 seconds. Slowly return your foot to the starting position. Repeat 10 times. Complete this exercise 2 times a day. Towel curls   Sit in a chair on a non-carpeted surface, and put your feet on the floor. Place a towel in front of your feet. Keeping your heel on the floor, put your left / right foot on the towel. Pull the towel toward you by grabbing the towel with your toes and curling them under. Keep your heel on the floor while you do this. Let your toes relax. Grab the towel with your toes again. Keep going until the towel is completely underneath your foot. Repeat 10 times. Complete this exercise 2 times a day. Balance exercise This exercise improves or maintains your balance. Balance is important in preventing falls. Single leg stand Without wearing shoes, stand near a railing or in a doorway. You may hold on to the railing or door frame as needed for balance. Stand on your left / right foot. Keep your big toe down on the floor and try to keep your arch lifted. If balancing in this position is too easy, try the exercise with your eyes closed or while standing on a pillow. Hold this position for 10 seconds. Repeat 10 times. Complete this exercise 2 times a day. This information is not intended to replace advice given to you by your health care provider. Make sure you discuss any questions you have with your health care provider.  

## 2023-01-23 NOTE — Progress Notes (Signed)
  Subjective:  Patient ID: Sarah Shaw, female    DOB: September 02, 1975,  MRN: [id number]  Chief Complaint  Patient presents with   Foot Pain    "I have pain on the inner side of my right foot."    47 y.o. female presents with the above complaint. History confirmed with patient.  She returns for follow-up she is having pain now in the inside of the arch and along the medial ankle worsening over the last couple months  Objective:  Physical Exam: warm, good capillary refill, no trophic changes or ulcerative lesions, normal DP and PT pulses, and normal sensory exam. Left Foot: normal exam, no swelling, tenderness, instability; ligaments intact, full range of motion of all ankle/foot joints Right Foot: Mild pes planus and hallux valgus deformity.  Pain along the PT tendon at its insertion none in the retromalleolar groove    Radiographs: Multiple views x-ray of the right foot: New radiographs taken today show unchanged alignment of hallux valgus deformity, prominent medial Bako tuberosity without discrete accessory navicular Assessment:   1. Posterior tibial tendinitis of right lower extremity   2. Pes planus of both feet      Plan:  Patient was evaluated and treated and all questions answered.  We discussed today she is having pain at the insertion of the PT tendon with PT tendinitis.  There is no tenosynovitis a sending into the retromalleolar groove.  I discussed treatment of this with her including anti-inflammatory treatment with NSAIDs we discussed use of meloxicam she would prefer to use OTC ibuprofen which is okay, I also recommended to supporting with a Tri-Lock ankle brace which was dispensed today and a home physical therapy plan.  I will see her back in 6 weeks to reevaluate.  If not improved by that point would recommend boot immobilization and MRI.  Return in about 6 weeks (around 03/05/2023) for follow up PT tendinitis.

## 2023-02-13 ENCOUNTER — Encounter: Payer: Self-pay | Admitting: Genetic Counselor

## 2023-02-14 ENCOUNTER — Encounter: Payer: Self-pay | Admitting: Genetic Counselor

## 2023-02-14 ENCOUNTER — Other Ambulatory Visit: Payer: Self-pay | Admitting: Genetic Counselor

## 2023-02-14 ENCOUNTER — Inpatient Hospital Stay: Payer: Commercial Managed Care - PPO

## 2023-02-14 ENCOUNTER — Inpatient Hospital Stay: Payer: Commercial Managed Care - PPO | Attending: Hematology | Admitting: Genetic Counselor

## 2023-02-14 DIAGNOSIS — Z800 Family history of malignant neoplasm of digestive organs: Secondary | ICD-10-CM

## 2023-02-14 NOTE — Progress Notes (Signed)
 REFERRING PROVIDER: Lanny Callander, MD [address] [place],  [place] [address]  PRIMARY PROVIDER:  Georgina Speaks, FNP  PRIMARY REASON FOR VISIT:  1. Family history of colon cancer   2. Family history of pancreatic cancer      HISTORY OF PRESENT ILLNESS:   Sarah Shaw, a 48 y.o. female, was seen for a [facility] cancer genetics consultation at the request of Dr. Lanny due to a family history of colon and pancreatic cancer.  Sarah Shaw presents to clinic today to discuss the possibility of a hereditary predisposition to cancer, genetic testing, and to further clarify her future cancer risks, as well as potential cancer risks for family members.   Sarah Shaw is a 48 y.o. female with no personal history of cancer.    CANCER HISTORY:  Oncology History   No history exists.     RISK FACTORS:  Menarche was at age 36.  First live birth at age 22.  OCP use for approximately 0 years.  Ovaries intact: yes.  Hysterectomy: no.  Menopausal status: premenopausal.  HRT use: 0 years. Colonoscopy: yes; normal. Mammogram within the last year: yes. Number of breast biopsies: 0. Up to date with pelvic exams: yes. Any excessive radiation exposure in the past: no  Past Medical History:  Diagnosis Date   Anemia    Family history of colon cancer    Family history of pancreatic cancer     No past surgical history on file.  Social History   Socioeconomic History   Marital status: Married    Spouse name: Not on file   Number of children: 3   Years of education: Not on file   Highest education level: Bachelor's degree (e.g., BA, AB, BS)  Occupational History   Occupation: Nurse [employer]  Tobacco Use   Smoking status: Never   Smokeless tobacco: Never  Substance and Sexual Activity   Alcohol use: Never   Drug use: Never   Sexual activity: Yes    Birth control/protection: Other-see comments    Comment: Husband had a vasectomy  Other Topics Concern   Not on file  Social History  Narrative   Not on file   Social Drivers of Health   Financial Resource Strain: Low Risk  (12/17/2022)   Overall Financial Resource Strain (CARDIA)    Difficulty of Paying Living Expenses: Not hard at all  Food Insecurity: No Food Insecurity (12/30/2022)   Hunger Vital Sign    Worried About Running Out of Food in the Last Year: Never true    Ran Out of Food in the Last Year: Never true  Transportation Needs: No Transportation Needs (12/30/2022)   PRAPARE - Administrator, Civil Service (Medical): No    Lack of Transportation (Non-Medical): No  Physical Activity: Insufficiently Active (12/17/2022)   Exercise Vital Sign    Days of Exercise per Week: 5 days    Minutes of Exercise per Session: 20 min  Stress: No Stress Concern Present (12/17/2022)   [employer] of Occupational Health - Occupational Stress Questionnaire    Feeling of Stress : Not at all  Social Connections: Socially Integrated (12/17/2022)   Social Connection and Isolation Panel [NHANES]    Frequency of Communication with Friends and Family: More than three times a week    Frequency of Social Gatherings with Friends and Family: Once a week    Attends Religious Services: More than 4 times per year    Active Member of [employer] or Organizations:  Yes    Attends Banker Meetings: More than 4 times per year    Marital Status: Married     FAMILY HISTORY:  We obtained a detailed, 4-generation family history.  Significant diagnoses are listed below: Family History  Problem Relation Age of Onset   Healthy Mother    Colon cancer Father 5   Colon cancer Paternal Aunt 64       Concol cancer   Colon cancer Paternal Uncle 65       colon cancer   Dementia Maternal Grandmother    Heart disease Maternal Grandfather    Pancreatic cancer Paternal Grandmother        pancreatic cancer   Multiple myeloma Paternal Grandfather        multiple myeloma     The patient has three sons who are cancer  free. She has a sister who is cancer free.  Both parents are living.  The patient's father was diagnosed with stage IV colon cancer at 53.  He is now 18.  He had two brothers and a sister.  One brother and sister had colon cancer. The paternal grandparents are deceased.  The grandmother had pancreatic cancer and the grandfather had multiple myeloma.  The patient's mother is living .  She had a brother and sister who were cancer free.  Her parents did not have cancer.  She had one maternal cousin with bladder cancer.  Sarah Shaw is unaware of previous family history of genetic testing for hereditary cancer risks. There is no reported Ashkenazi Jewish ancestry. There is no known consanguinity.  GENETIC COUNSELING ASSESSMENT: Sarah Shaw is a 48 y.o. female with a family history of colon and pancreatic cancer which is somewhat suggestive of a hereditary cancer syndrome such as Lynch syndrome and predisposition to cancer given the combination of cancer in the family. We, therefore, discussed and recommended the following at today's visit.   DISCUSSION: We discussed that, in general, most cancer is not inherited in families, but instead is sporadic or familial. Sporadic cancers occur by chance and typically happen at older ages (>50 years) as this type of cancer is caused by genetic changes acquired during an individual's lifetime. Some families have more cancers than would be expected by chance; however, the ages or types of cancer are not consistent with a known genetic mutation or known genetic mutations have been ruled out. This type of familial cancer is thought to be due to a combination of multiple genetic, environmental, hormonal, and lifestyle factors. While this combination of factors likely increases the risk of cancer, the exact source of this risk is not currently identifiable or testable.  We discussed that 5 - 10% of colon cancer is hereditary, with most cases associated with Lynch syndrome.   There are other genes that can be associated with hereditary colon cancer syndromes.  These include APC, MUTYH, BMPR1A and a few others.  We discussed that testing is beneficial for several reasons including knowing how to follow individuals after completing their treatment, identifying whether potential treatment options such as PARP inhibitors would be beneficial, and understand if other family members could be at risk for cancer and allow them to undergo genetic testing.   We reviewed the characteristics, features and inheritance patterns of hereditary cancer syndromes. We also discussed genetic testing, including the appropriate family members to test, the process of testing, insurance coverage and turn-around-time for results. We discussed the implications of a negative, positive, carrier and/or variant of uncertain significant  result. Sarah Shaw  was offered a common hereditary cancer panel (36+ genes) and an expanded pan-cancer panel (70+ genes). Sarah Shaw was informed of the benefits and limitations of each panel, including that expanded pan-cancer panels contain genes that do not have clear management guidelines at this point in time.  We also discussed that as the number of genes included on a panel increases, the chances of variants of uncertain significance increases.   Based on Sarah Shaw's family history of cancer, she meets medical criteria for genetic testing.  Despite that she meets criteria, she may still have an out of pocket cost. We discussed that if her out of pocket cost for testing is over $100, the laboratory will call and confirm whether she wants to proceed with testing.  If the out of pocket cost of testing is less than $100 she will be billed by the genetic testing laboratory.   We discussed that some people do not want to undergo genetic testing due to fear of genetic discrimination.  The Genetic Information Nondiscrimination Act (GINA) was signed into federal law in 2008. GINA  prohibits health insurers and most employers from discriminating against individuals based on genetic information (including the results of genetic tests and family history information). According to GINA, health insurance companies cannot consider genetic information to be a preexisting condition, nor can they use it to make decisions regarding coverage or rates. GINA also makes it illegal for most employers to use genetic information in making decisions about hiring, firing, promotion, or terms of employment. It is important to note that GINA does not offer protections for life insurance, disability insurance, or long-term care insurance. GINA does not apply to those in the [employer], those who work for companies with less than 15 employees, and new life insurance or long-term disability insurance policies.  Health status due to a cancer diagnosis is not protected under GINA. More information about GINA can be found by visiting eliteclients.be.  PLAN: Despite our recommendation, Sarah Shaw did not wish to pursue genetic testing at today's visit, based on her father being more informative for the family to undergo genetics testing. We, therefore, recommend Sarah Shaw continue to follow the cancer screening guidelines given by her primary healthcare provider.  Based on Sarah Shaw's family history, we recommended her father, who was diagnosed with colon cancer at age 49, have genetic counseling and testing. Sarah Shaw will let us  know if we can be of any assistance in coordinating genetic counseling and/or testing for this family member.   Lastly, we encouraged Sarah Shaw to remain in contact with cancer genetics annually so that we can continuously update the family history and inform her of any changes in cancer genetics and testing that may be of benefit for this family.   Sarah Shaw questions were answered to her satisfaction today. Our contact information was provided should additional questions or concerns  arise. Thank you for the referral and allowing us  to share in the care of your patient.   Annete Ayuso P. Perri, MS, CGC Licensed, Patent Attorney [email or web address] phone: [phone]  50 minutes were spent on the date of the encounter in service to the patient including preparation, face-to-face consultation, documentation and care coordination.  The patient was seen alone.  Drs. Lanny Stalls, and/or Gudena were available for questions, if needed..    _______________________________________________________________________ For Office Staff:  Number of people involved in session: 1 Was an Intern/ student involved with case: no

## 2023-03-05 ENCOUNTER — Encounter: Payer: Self-pay | Admitting: Podiatry

## 2023-03-05 ENCOUNTER — Ambulatory Visit: Payer: Commercial Managed Care - PPO | Admitting: Podiatry

## 2023-03-05 DIAGNOSIS — M2141 Flat foot [pes planus] (acquired), right foot: Secondary | ICD-10-CM | POA: Diagnosis not present

## 2023-03-05 DIAGNOSIS — M76821 Posterior tibial tendinitis, right leg: Secondary | ICD-10-CM | POA: Diagnosis not present

## 2023-03-05 DIAGNOSIS — M2142 Flat foot [pes planus] (acquired), left foot: Secondary | ICD-10-CM | POA: Diagnosis not present

## 2023-03-05 NOTE — Progress Notes (Signed)
  Subjective:  Patient ID: Sarah Shaw, female    DOB: 1975/04/01,  MRN: [id number]  Chief Complaint  Patient presents with   Tendonitis    "It's good, good, better."    48 y.o. female presents with the above complaint. History confirmed with patient.  She is doing much better she has worn the brace and on the exercises.  No longer taking any ibuprofen and has not needed it  Objective:  Physical Exam: warm, good capillary refill, no trophic changes or ulcerative lesions, normal DP and PT pulses, and normal sensory exam. Left Foot: normal exam, no swelling, tenderness, instability; ligaments intact, full range of motion of all ankle/foot joints Right Foot: Mild pes planus and hallux valgus deformity.  No the PT tendon at its insertion.  5 out of 5 strength in all planes    Radiographs: Multiple views x-ray of the right foot: New radiographs taken today show unchanged alignment of hallux valgus deformity, prominent medial tuberosity without discrete accessory navicular Assessment:   1. Posterior tibial tendinitis of right lower extremity   2. Pes planus of both feet      Plan:  Patient was evaluated and treated and all questions answered.  Doing much better may discontinue and wean off the brace over the next 1 to 2 weeks.  We discussed the possibility of recurrence and to resume utilizing a brace and home therapy plan if it returns or worsens.  Discussed also could look at MRI if this recurs for the possibility of tearing but I do not expect this to be necessary at this point.  Return if symptoms worsen or fail to improve.

## 2023-03-30 ENCOUNTER — Other Ambulatory Visit: Payer: Self-pay

## 2023-03-30 DIAGNOSIS — Z800 Family history of malignant neoplasm of digestive organs: Secondary | ICD-10-CM

## 2023-04-02 ENCOUNTER — Inpatient Hospital Stay: Payer: Commercial Managed Care - PPO | Attending: Hematology

## 2023-04-02 DIAGNOSIS — D509 Iron deficiency anemia, unspecified: Secondary | ICD-10-CM | POA: Insufficient documentation

## 2023-04-02 DIAGNOSIS — Z800 Family history of malignant neoplasm of digestive organs: Secondary | ICD-10-CM

## 2023-04-02 LAB — CBC WITH DIFFERENTIAL (CANCER CENTER ONLY)
Abs Immature Granulocytes: 0.04 10*3/uL (ref 0.00–0.07)
Basophils Absolute: 0 10*3/uL (ref 0.0–0.1)
Basophils Relative: 0 %
Eosinophils Absolute: 0.2 10*3/uL (ref 0.0–0.5)
Eosinophils Relative: 2 %
HCT: 34.2 % — ABNORMAL LOW (ref 36.0–46.0)
Hemoglobin: 11 g/dL — ABNORMAL LOW (ref 12.0–15.0)
Immature Granulocytes: 0 %
Lymphocytes Relative: 23 %
Lymphs Abs: 2.2 10*3/uL (ref 0.7–4.0)
MCH: 24 pg — ABNORMAL LOW (ref 26.0–34.0)
MCHC: 32.2 g/dL (ref 30.0–36.0)
MCV: 74.7 fL — ABNORMAL LOW (ref 80.0–100.0)
Monocytes Absolute: 0.6 10*3/uL (ref 0.1–1.0)
Monocytes Relative: 7 %
Neutro Abs: 6.2 10*3/uL (ref 1.7–7.7)
Neutrophils Relative %: 68 %
Platelet Count: 288 10*3/uL (ref 150–400)
RBC: 4.58 MIL/uL (ref 3.87–5.11)
RDW: 17.1 % — ABNORMAL HIGH (ref 11.5–15.5)
WBC Count: 9.3 10*3/uL (ref 4.0–10.5)
nRBC: 0 % (ref 0.0–0.2)

## 2023-04-02 LAB — CMP (CANCER CENTER ONLY)
ALT: 15 U/L (ref 0–44)
AST: 14 U/L — ABNORMAL LOW (ref 15–41)
Albumin: 4.2 g/dL (ref 3.5–5.0)
Alkaline Phosphatase: 49 U/L (ref 38–126)
Anion gap: 5 (ref 5–15)
BUN: 14 mg/dL (ref 6–20)
CO2: 24 mmol/L (ref 22–32)
Calcium: 9.5 mg/dL (ref 8.9–10.3)
Chloride: 106 mmol/L (ref 98–111)
Creatinine: 0.85 mg/dL (ref 0.44–1.00)
GFR, Estimated: >60 mL/min (ref >60)
Glucose, Bld: 92 mg/dL (ref 70–99)
Potassium: 3.9 mmol/L (ref 3.5–5.1)
Sodium: 135 mmol/L (ref 135–145)
Total Bilirubin: 0.4 mg/dL (ref 0.0–1.2)
Total Protein: 7.3 g/dL (ref 6.5–8.1)

## 2023-04-05 ENCOUNTER — Encounter: Payer: Self-pay | Admitting: Hematology

## 2023-04-05 ENCOUNTER — Inpatient Hospital Stay (HOSPITAL_BASED_OUTPATIENT_CLINIC_OR_DEPARTMENT_OTHER): Payer: Commercial Managed Care - PPO | Admitting: Hematology

## 2023-04-05 DIAGNOSIS — D500 Iron deficiency anemia secondary to blood loss (chronic): Secondary | ICD-10-CM

## 2023-04-05 DIAGNOSIS — D509 Iron deficiency anemia, unspecified: Secondary | ICD-10-CM | POA: Diagnosis not present

## 2023-04-05 NOTE — Assessment & Plan Note (Signed)
-  Likely secondary to menorrhagia -On oral iron, and IV iron as needed

## 2023-04-05 NOTE — Progress Notes (Signed)
 [facility]   [phone] [phone]   Clinic Follow up Note   Patient Care Team: Arnette Felts, FNP as PCP - General (General Practice) 04/05/2023  I connected with Sarah Shaw on 04/05/23 at  2:20 PM EST by telephone and verified that I am speaking with the correct person using two identifiers.   I discussed the limitations, risks, security and privacy concerns of performing an evaluation and management service by telephone and the availability of in person appointments. I also discussed with the patient that there may be a patient responsible charge related to this service. The patient expressed understanding and agreed to proceed.   Patient's location:  Work  Provider's location:  Office    CHIEF COMPLAINT: f/u IDA   CURRENT THERAPY: oral iron   Oncology history Iron deficiency anemia -Likely secondary to menorrhagia -On oral iron, and IV iron as needed  Assessment and Plan    Iron Deficiency Anemia Iron deficiency anemia with slight improvement in hemoglobin from 10.8 to 11 g/dL. MCV remains low, indicating ongoing iron deficiency. Tolerating over-the-counter slow-release iron every other day without constipation, plans to increase to daily dosing. Mild fatigue around menstrual cycle but otherwise asymptomatic. Discussed that anemia is mild and does not currently necessitate IV iron unless symptoms worsen or hemoglobin drops below 10 g/dL. Advised to consult gynecologist regarding potential treatments to reduce menstrual bleeding, which may help manage anemia. Explained that many function well with mild anemia and IV iron would be considered if significant fatigue develops or hemoglobin drops below 10 g/dL. - Increase over-the-counter slow-release iron to daily dosing - Consult gynecologist for potential treatments to reduce menstrual bleeding - Monitor hemoglobin and iron levels with family doctor in March - Consider IV iron if hemoglobin  drops below 10 g/dL or if significant fatigue develops  General Health Maintenance Routine health maintenance managed by family doctor. Previous labs showed normal B12, normal thyroid function, and improved vitamin D levels. - Follow up with family doctor for routine blood work in March and physical in July  Plan -Recent lab reviewed, hemoglobin 11.0, she is asymptomatic.  She will continue oral iron. -She will follow-up with her primary care physician and repeat CBC and iron study every 3 to 6 months.  I will see her as needed in future.        Discussed the use of AI scribe software for clinical note transcription with the patient, who gave verbal consent to proceed.  History of Present Illness   The patient, a 48 year old with a history of iron deficient anemia, was last seen in November of the previous year. She was prescribed an iron supplement, which was denied by her insurance. She has since been taking over-the-counter slow-release iron supplements every other day without any side effects such as constipation. She plans to increase the frequency to daily. She reports feeling well overall, with only mild fatigue around the time of her menstrual cycle, which is heavier on the first day or two. She has discussed options to manage heavy menstrual bleeding with her gynecologist, including ablation.         REVIEW OF SYSTEMS:   Constitutional: Denies fevers, chills or abnormal weight loss Eyes: Denies blurriness of vision Ears, nose, mouth, throat, and face: Denies mucositis or sore throat Respiratory: Denies cough, dyspnea or wheezes Cardiovascular: Denies palpitation, chest discomfort or lower extremity swelling Gastrointestinal:  Denies nausea, heartburn or change in bowel habits Skin: Denies abnormal skin rashes Lymphatics: Denies  new lymphadenopathy or easy bruising Neurological:Denies numbness, tingling or new weaknesses Behavioral/Psych: Mood is stable, no new changes  All  other systems were reviewed with the patient and are negative.  MEDICAL HISTORY:  Past Medical History:  Diagnosis Date   Anemia    Family history of colon cancer    Family history of pancreatic cancer     SURGICAL HISTORY: No past surgical history on file.  I have reviewed the social history and family history with the patient and they are unchanged from previous note.  ALLERGIES:  has no known allergies.  MEDICATIONS:  Current Outpatient Medications  Medication Sig Dispense Refill   cyanocobalamin 100 MCG tablet Take 100 mcg by mouth daily.     Ferric Maltol (ACCRUFER) 30 MG CAPS Take 1 capsule (30 mg total) by mouth daily. 30 capsule 3   ferrous sulfate 325 (65 FE) MG tablet Take 325 mg by mouth daily with breakfast.     Vitamin D, Ergocalciferol, (DRISDOL) 1.25 MG (50000 UNIT) CAPS capsule Take 1 capsule (50,000 Units total) by mouth every 7 (seven) days. 12 capsule 1   No current facility-administered medications for this visit.    PHYSICAL EXAMINATION: Not performed   LABORATORY DATA:  I have reviewed the data as listed    Latest Ref Rng & Units 04/02/2023    2:33 PM 08/09/2022    4:19 PM 08/11/2020    4:31 PM  CBC  WBC 4.0 - 10.5 K/uL 9.3  7.8  8.4   Hemoglobin 12.0 - 15.0 g/dL 16.1  09.6  04.5   Hematocrit 36.0 - 46.0 % 34.2  34.2  35.3   Platelets 150 - 400 K/uL 288  283  241         Latest Ref Rng & Units 04/02/2023    2:33 PM 08/09/2022    4:19 PM 05/05/2021   12:36 PM  CMP  Glucose 70 - 99 mg/dL 92  85  92   BUN 6 - 20 mg/dL 14  10  10    Creatinine 0.44 - 1.00 mg/dL 4.09  8.11  9.14   Sodium 135 - 145 mmol/L 135  135  138   Potassium 3.5 - 5.1 mmol/L 3.9  4.2  4.3   Chloride 98 - 111 mmol/L 106  101  104   CO2 22 - 32 mmol/L 24  20  23    Calcium 8.9 - 10.3 mg/dL 9.5  9.1  9.6   Total Protein 6.5 - 8.1 g/dL 7.3  6.7    Total Bilirubin 0.0 - 1.2 mg/dL 0.4  0.4    Alkaline Phos 38 - 126 U/L 49  52    AST 15 - 41 U/L 14  17    ALT 0 - 44 U/L 15  15         RADIOGRAPHIC STUDIES: I have personally reviewed the radiological images as listed and agreed with the findings in the report. No results found.     I discussed the assessment and treatment plan with the patient. The patient was provided an opportunity to ask questions and all were answered. The patient agreed with the plan and demonstrated an understanding of the instructions.   The patient was advised to call back or seek an in-person evaluation if the symptoms worsen or if the condition fails to improve as anticipated.  I provided 12 minutes of non face-to-face telephone visit time during this encounter, and > 50% was spent counseling as documented under my assessment &  plan.     Malachy Mood, MD 04/05/23

## 2023-04-17 ENCOUNTER — Encounter: Payer: Self-pay | Admitting: Nurse Practitioner

## 2023-04-17 ENCOUNTER — Ambulatory Visit: Payer: Commercial Managed Care - PPO | Admitting: Nurse Practitioner

## 2023-04-17 VITALS — BP 120/72 | HR 88 | Temp 98.5°F | Ht 66.0 in | Wt 223.8 lb

## 2023-04-17 DIAGNOSIS — Z6836 Body mass index (BMI) 36.0-36.9, adult: Secondary | ICD-10-CM

## 2023-04-17 DIAGNOSIS — R7303 Prediabetes: Secondary | ICD-10-CM | POA: Diagnosis not present

## 2023-04-17 DIAGNOSIS — D500 Iron deficiency anemia secondary to blood loss (chronic): Secondary | ICD-10-CM

## 2023-04-17 DIAGNOSIS — E6609 Other obesity due to excess calories: Secondary | ICD-10-CM

## 2023-04-17 DIAGNOSIS — E66812 Obesity, class 2: Secondary | ICD-10-CM

## 2023-04-17 NOTE — Progress Notes (Signed)
 Madelaine Bhat, CMA,acting as a Neurosurgeon for Arnette Felts, FNP.,have documented all relevant documentation on the behalf of Arnette Felts, FNP,as directed by  Arnette Felts, FNP while in the presence of Arnette Felts, FNP.  Subjective:  Patient ID: Sarah Shaw , female    DOB: 11-25-1975 , 48 y.o.   MRN: [id number]  Chief Complaint  Patient presents with   Diabetes    HPI  Patient presents today for a pre dm follow up, Patient reports compliance with medication. Patient denies any chest pain, SOB, or headaches. Patient has no concerns today. She was in a brace for her right foot after developing plantar fascitits and was not able to walk.   She is taking the slow release iron. She does not eat a lot of red meat. She has been seen at Hematology and will let them know if she wants the infusion of iron. She had a CBC and Hgb increased slightly.      Past Medical History:  Diagnosis Date   Anemia    Family history of colon cancer    Family history of pancreatic cancer      Family History  Problem Relation Age of Onset   Healthy Mother    Colon cancer Father 67   Colon cancer Paternal Aunt 90       Concol cancer   Colon cancer Paternal Uncle 62       colon cancer   Dementia Maternal Grandmother    Heart disease Maternal Grandfather    Pancreatic cancer Paternal Grandmother        pancreatic cancer   Multiple myeloma Paternal Grandfather        multiple myeloma     Current Outpatient Medications:    cyanocobalamin 100 MCG tablet, Take 100 mcg by mouth daily., Disp: , Rfl:    ferrous sulfate 325 (65 FE) MG tablet, Take 325 mg by mouth daily with breakfast., Disp: , Rfl:    Vitamin D, Ergocalciferol, (DRISDOL) 1.25 MG (50000 UNIT) CAPS capsule, Take 1 capsule (50,000 Units total) by mouth every 7 (seven) days., Disp: 12 capsule, Rfl: 1   Ferric Maltol (ACCRUFER) 30 MG CAPS, Take 1 capsule (30 mg total) by mouth daily. (Patient not taking: Reported on 04/17/2023), Disp: 30 capsule,  Rfl: 3   No Known Allergies   Review of Systems  Constitutional: Negative.   Respiratory: Negative.    Cardiovascular: Negative.   Musculoskeletal: Negative.   Neurological: Negative.   Psychiatric/Behavioral: Negative.       Today's Vitals   04/17/23 1453  BP: 120/72  Pulse: 88  Temp: 98.5 F (36.9 C)  TempSrc: Tympanic  Weight: 223 lb 12.8 oz (101.5 kg)  Height: 5\' 6"  (1.676 m)  PainSc: 0-No pain   Body mass index is 36.12 kg/m.  Wt Readings from Last 3 Encounters:  04/17/23 223 lb 12.8 oz (101.5 kg)  12/30/22 216 lb 6.4 oz (98.2 kg)  12/18/22 214 lb 12.8 oz (97.4 kg)    The 10-year ASCVD risk score (Arnett DK, et al., 2019) is: 1.3%   Values used to calculate the score:     Age: 74 years     Sex: Female     Is Non-Hispanic African American: Yes     Diabetic: No     Tobacco smoker: No     Systolic Blood Pressure: 120 mmHg     Is BP treated: No     HDL Cholesterol: 48 mg/dL     Total Cholesterol:  182 mg/dL  Objective:  Physical Exam Vitals and nursing note reviewed.  Constitutional:      General: She is not in acute distress.    Appearance: Normal appearance. She is obese.  Cardiovascular:     Rate and Rhythm: Normal rate and regular rhythm.     Pulses: Normal pulses.     Heart sounds: Normal heart sounds. No murmur heard. Pulmonary:     Effort: Pulmonary effort is normal. No respiratory distress.     Breath sounds: Normal breath sounds. No wheezing.  Musculoskeletal:     Cervical back: Normal range of motion and neck supple.  Skin:    General: Skin is warm and dry.     Capillary Refill: Capillary refill takes less than 2 seconds.  Neurological:     General: No focal deficit present.     Mental Status: She is alert and oriented to person, place, and time.     Cranial Nerves: No cranial nerve deficit.     Motor: No weakness.  Psychiatric:        Mood and Affect: Mood normal.        Behavior: Behavior normal.        Thought Content: Thought content  normal.        Judgment: Judgment normal.         Assessment And Plan:  Prediabetes Assessment & Plan: No current medications, encouraged to exercise regularly and eat a healthy diet low in sugar and starches.  Orders: -     Hemoglobin A1c  Iron deficiency anemia due to chronic blood loss Assessment & Plan: Will check iron studies, she is currently taking an iron supplement recommended by Hematology.   Orders: -     Iron, TIBC and Ferritin Panel  Class 2 obesity due to excess calories with body mass index (BMI) of 36.0 to 36.9 in adult, unspecified whether serious comorbidity present Assessment & Plan: She is encouraged to strive for BMI less than 30 to decrease cardiac risk. Advised to aim for at least 150 minutes of exercise per week.      Return for KEEP SAME NEXT.  Patient was given opportunity to ask questions. Patient verbalized understanding of the plan and was able to repeat key elements of the plan. All questions were answered to their satisfaction.    Jeanell Sparrow, FNP, have reviewed all documentation for this visit. The documentation on 04/17/23 for the exam, diagnosis, procedures, and orders are all accurate and complete.   IF YOU HAVE BEEN REFERRED TO A SPECIALIST, IT MAY TAKE 1-2 WEEKS TO SCHEDULE/PROCESS THE REFERRAL. IF YOU HAVE NOT HEARD FROM US/SPECIALIST IN TWO WEEKS, PLEASE GIVE [place] A CALL AT [phone] X [phone].

## 2023-04-18 ENCOUNTER — Encounter: Payer: Self-pay | Admitting: Nurse Practitioner

## 2023-04-18 LAB — HEMOGLOBIN A1C
Est. average glucose Bld gHb Est-mCnc: 114 mg/dL
Hgb A1c MFr Bld: 5.6 % (ref 4.8–5.6)

## 2023-04-18 LAB — IRON,TIBC AND FERRITIN PANEL
Ferritin: 9 ng/mL — ABNORMAL LOW (ref 15–150)
Iron Saturation: 8 % — CL (ref 15–55)
Iron: 34 ug/dL (ref 27–159)
Total Iron Binding Capacity: 409 ug/dL (ref 250–450)
UIBC: 375 ug/dL (ref 131–425)

## 2023-04-29 ENCOUNTER — Encounter: Payer: Self-pay | Admitting: Nurse Practitioner

## 2023-04-29 DIAGNOSIS — Z6836 Body mass index (BMI) 36.0-36.9, adult: Secondary | ICD-10-CM | POA: Insufficient documentation

## 2023-04-29 NOTE — Assessment & Plan Note (Addendum)
 She is encouraged to strive for BMI less than 30 to decrease cardiac risk. Advised to aim for at least 150 minutes of exercise per week as tolerated

## 2023-04-29 NOTE — Assessment & Plan Note (Signed)
 Will check iron studies, she is currently taking an iron supplement recommended by Hematology.

## 2023-04-29 NOTE — Assessment & Plan Note (Signed)
No current medications, encouraged to exercise regularly and eat a healthy diet low in sugar and starches.

## 2023-06-09 ENCOUNTER — Other Ambulatory Visit: Payer: Self-pay | Admitting: Nurse Practitioner

## 2023-06-09 DIAGNOSIS — E559 Vitamin D deficiency, unspecified: Secondary | ICD-10-CM

## 2023-06-11 ENCOUNTER — Other Ambulatory Visit (HOSPITAL_COMMUNITY): Payer: Self-pay

## 2023-06-11 MED ORDER — VITAMIN D (ERGOCALCIFEROL) 1.25 MG (50000 UNIT) PO CAPS
50000.0000 [IU] | ORAL_CAPSULE | ORAL | 1 refills | Status: DC
  Filled 2023-06-11: qty 12, 84d supply, fill #0
  Filled 2023-09-15: qty 12, 84d supply, fill #1

## 2023-07-05 DIAGNOSIS — Z113 Encounter for screening for infections with a predominantly sexual mode of transmission: Secondary | ICD-10-CM | POA: Diagnosis not present

## 2023-07-05 DIAGNOSIS — Z124 Encounter for screening for malignant neoplasm of cervix: Secondary | ICD-10-CM | POA: Diagnosis not present

## 2023-07-05 DIAGNOSIS — Z01411 Encounter for gynecological examination (general) (routine) with abnormal findings: Secondary | ICD-10-CM | POA: Diagnosis not present

## 2023-07-05 DIAGNOSIS — Z1231 Encounter for screening mammogram for malignant neoplasm of breast: Secondary | ICD-10-CM | POA: Diagnosis not present

## 2023-07-05 DIAGNOSIS — D649 Anemia, unspecified: Secondary | ICD-10-CM | POA: Diagnosis not present

## 2023-07-05 DIAGNOSIS — Z01419 Encounter for gynecological examination (general) (routine) without abnormal findings: Secondary | ICD-10-CM | POA: Diagnosis not present

## 2023-07-05 DIAGNOSIS — D259 Leiomyoma of uterus, unspecified: Secondary | ICD-10-CM | POA: Diagnosis not present

## 2023-08-13 ENCOUNTER — Encounter: Payer: Self-pay | Admitting: Nurse Practitioner

## 2023-08-13 ENCOUNTER — Other Ambulatory Visit (HOSPITAL_COMMUNITY): Payer: Self-pay

## 2023-08-13 ENCOUNTER — Ambulatory Visit (INDEPENDENT_AMBULATORY_CARE_PROVIDER_SITE_OTHER): Payer: Commercial Managed Care - PPO | Admitting: Nurse Practitioner

## 2023-08-13 VITALS — BP 120/80 | HR 82 | Temp 98.5°F | Ht 66.0 in | Wt 221.8 lb

## 2023-08-13 DIAGNOSIS — L309 Dermatitis, unspecified: Secondary | ICD-10-CM | POA: Diagnosis not present

## 2023-08-13 DIAGNOSIS — D508 Other iron deficiency anemias: Secondary | ICD-10-CM | POA: Diagnosis not present

## 2023-08-13 DIAGNOSIS — Z0000 Encounter for general adult medical examination without abnormal findings: Secondary | ICD-10-CM

## 2023-08-13 DIAGNOSIS — E669 Obesity, unspecified: Secondary | ICD-10-CM

## 2023-08-13 DIAGNOSIS — E7800 Pure hypercholesterolemia, unspecified: Secondary | ICD-10-CM

## 2023-08-13 DIAGNOSIS — R7303 Prediabetes: Secondary | ICD-10-CM | POA: Diagnosis not present

## 2023-08-13 DIAGNOSIS — E041 Nontoxic single thyroid nodule: Secondary | ICD-10-CM

## 2023-08-13 DIAGNOSIS — Z1159 Encounter for screening for other viral diseases: Secondary | ICD-10-CM

## 2023-08-13 DIAGNOSIS — Z79899 Other long term (current) drug therapy: Secondary | ICD-10-CM

## 2023-08-13 DIAGNOSIS — E559 Vitamin D deficiency, unspecified: Secondary | ICD-10-CM | POA: Diagnosis not present

## 2023-08-13 MED ORDER — MOMETASONE FUROATE 0.1 % EX CREA
1.0000 | TOPICAL_CREAM | Freq: Every day | CUTANEOUS | 3 refills | Status: AC
  Filled 2023-08-13: qty 45, 30d supply, fill #0

## 2023-08-13 NOTE — Progress Notes (Signed)
 LILLETTE Kristeen JINNY Gladis, CMA,acting as a Neurosurgeon for Gaines Ada, FNP.,have documented all relevant documentation on the behalf of Gaines Ada, FNP,as directed by  Gaines Ada, FNP while in the presence of Gaines Ada, FNP.  Subjective:    Patient ID: Sarah Shaw , female    DOB: [birth year] , 48 y.o.   MRN: [id number]  Chief Complaint  Patient presents with   Annual Exam    Patient presents today for HM, Patient reports compliance with medication. Patient denies any chest pain, SOB, or headaches. Patient has no concerns today.     HPI Discussed the use of AI scribe software for clinical note transcription with the patient, who gave verbal consent to proceed.  History of Present Illness Sarah Shaw is a 48 year old female who presents for an annual physical exam.  She has experienced slightly irregular menstrual cycles, with the last cycle occurring 32 days ago. No trouble swallowing, chest pain, or shortness of breath. She does not work nights and reports no trouble sleeping.  She has a history of iron deficiency and is under the care of an oncologist/hematologist. She takes slow-release iron supplements. She last saw her oncologist/hematologist in March.  She experiences eczema, which recently had a severe breakout. She has been using over-the-counter Benadryl and hydrocortisone. The eczema was previously red, inflamed, and itchy, but currently, it is not itching and has left scars. Symptoms worsen in hot weather. She has changed laundry detergents to address potential irritants.  Her diet is low in sugar and fat, avoiding sodas and sweets. She exercises regularly by walking three to four days a week for 30 to 45 minutes. She has recently started strength training two weeks ago. She does not use sunscreen regularly as she is not often outside for extended periods.  She is not on any birth control and has not had a hysterectomy. Her husband has had a vasectomy. She receives gynecological  care at [facility] and is seen one of the midwives.   She goes to [facility] for her GYN needs     Past Medical History:  Diagnosis Date   Anemia    Family history of colon cancer    Family history of pancreatic cancer    GERD (gastroesophageal reflux disease) 1990     Family History  Problem Relation Age of Onset   Cancer Mother    Colon cancer Father 38   Cancer Father    Colon cancer Paternal Aunt 75       Concol cancer   Colon cancer Paternal Uncle 73       colon cancer   Dementia Maternal Grandmother    Heart disease Maternal Grandfather    Heart attack Maternal Grandfather    Pancreatic cancer Paternal Grandmother        pancreatic cancer   Cancer Paternal Grandmother    Multiple myeloma Paternal Grandfather        multiple myeloma   Cancer Paternal Grandfather    Cancer Paternal Aunt    Cancer Paternal Uncle      Current Outpatient Medications:    cyanocobalamin  100 MCG tablet, Take 100 mcg by mouth daily., Disp: , Rfl:    ferrous sulfate  325 (65 FE) MG tablet, Take 325 mg by mouth daily with breakfast., Disp: , Rfl:    mometasone  (ELOCON ) 0.1 % cream, Apply 1 Application topically daily., Disp: 45 g, Rfl: 3   Vitamin D , Ergocalciferol , (DRISDOL ) 1.25 MG (50000 UNIT) CAPS capsule,  Take 1 capsule (50,000 Units total) by mouth every 7 (seven) days., Disp: 12 capsule, Rfl: 1   No Known Allergies    The patient states she uses vasectomy for birth control. Patient's last menstrual period was 07/12/2023 (approximate).. Negative for Dysmenorrhea and Negative for Menorrhagia. Negative for: breast discharge, breast lump(s), breast pain and breast self exam. Associated symptoms include abnormal vaginal bleeding. Pertinent negatives include abnormal bleeding (hematology), anxiety, decreased libido, depression, difficulty falling sleep, dyspareunia, history of infertility, nocturia, sexual dysfunction, sleep disturbances, urinary incontinence,  urinary urgency, vaginal discharge and vaginal itching. Diet regular; low sugar/low fat and limited sodas. The patient states her exercise level is minimal with walking 3-4 days a week for 30-45 minutes.   The patient's tobacco use is:  Social History   Tobacco Use  Smoking Status Never  Smokeless Tobacco Never   She has been exposed to passive smoke. The patient's alcohol use is:  Social History   Substance and Sexual Activity  Alcohol Use Never  Additional information: Last pap 05/31/2021, next one scheduled for 05/31/2024, she has also had her mammogram.    Review of Systems  Constitutional:  Negative for fatigue.  HENT: Negative.    Eyes: Negative.   Respiratory: Negative.    Cardiovascular: Negative.   Gastrointestinal: Negative.   Endocrine: Negative.   Genitourinary: Negative.   Musculoskeletal: Negative.   Skin: Negative.   Allergic/Immunologic: Negative.   Neurological: Negative.   Hematological: Negative.   Psychiatric/Behavioral: Negative.       Today's Vitals   08/13/23 1526  BP: 120/80  Pulse: 82  Temp: 98.5 F (36.9 C)  TempSrc: Oral  Weight: 221 lb 12.8 oz (100.6 kg)  Height: 5' 6 (1.676 m)  PainSc: 0-No pain   Body mass index is 35.8 kg/m.  Wt Readings from Last 3 Encounters:  08/13/23 221 lb 12.8 oz (100.6 kg)  04/17/23 223 lb 12.8 oz (101.5 kg)  12/30/22 216 lb 6.4 oz (98.2 kg)     Objective:  Physical Exam Vitals and nursing note reviewed.  Constitutional:      General: She is not in acute distress.    Appearance: Normal appearance. She is well-developed. She is obese.  HENT:     Head: Normocephalic and atraumatic.     Right Ear: Hearing, tympanic membrane, ear canal and external ear normal. There is no impacted cerumen.     Left Ear: Hearing, tympanic membrane, ear canal and external ear normal. There is no impacted cerumen.     Nose: Nose normal.     Mouth/Throat:     Mouth: Mucous membranes are moist.  Eyes:     General: Lids are  normal.     Extraocular Movements: Extraocular movements intact.     Conjunctiva/sclera: Conjunctivae normal.     Pupils: Pupils are equal, round, and reactive to light.     Funduscopic exam:    Right eye: No papilledema.        Left eye: No papilledema.  Neck:     Thyroid : Thyromegaly present. No thyroid  mass.     Vascular: No carotid bruit.  Cardiovascular:     Rate and Rhythm: Normal rate and regular rhythm.     Pulses: Normal pulses.     Heart sounds: Normal heart sounds. No murmur heard. Pulmonary:     Effort: Pulmonary effort is normal. No respiratory distress.     Breath sounds: Normal breath sounds. No wheezing.  Abdominal:     General: Abdomen is flat. Bowel  sounds are normal. There is no distension.     Palpations: Abdomen is soft.     Tenderness: There is no abdominal tenderness.  Genitourinary:    Comments: Deferred followed by GYN Musculoskeletal:        General: No swelling. Normal range of motion.     Cervical back: Full passive range of motion without pain, normal range of motion and neck supple. No rigidity.     Right lower leg: No edema.     Left lower leg: No edema.  Skin:    General: Skin is warm and dry.     Capillary Refill: Capillary refill takes less than 2 seconds.  Neurological:     General: No focal deficit present.     Mental Status: She is alert and oriented to person, place, and time.     Cranial Nerves: No cranial nerve deficit.     Sensory: No sensory deficit.     Motor: No weakness.  Psychiatric:        Mood and Affect: Mood normal.        Behavior: Behavior normal.        Thought Content: Thought content normal.        Judgment: Judgment normal.     Assessment And Plan:     Encounter for annual health examination Assessment & Plan: Up-to-date with Pap smear and mammogram. Hepatitis B vaccination status needs verification. Regular dental visits and self-breast exams. Slightly irregular menstrual cycle. - Verify hepatitis B vaccination  status and perform titer if necessary. - Encourage continued regular screenings and self-exams. - Advise on the use of sunscreen during prolonged sun exposure.   Prediabetes Assessment & Plan: No current medications, encouraged to exercise regularly and eat a healthy diet low in sugar and starches.  Orders: -     CMP14+EGFR -     Hemoglobin A1c  Iron deficiency anemia secondary to inadequate dietary iron intake Assessment & Plan: Managed with oral slow-release iron supplements. IV iron recommended only if levels drop significantly. - Continue oral slow-release iron supplementation.  Orders: -     Iron, TIBC and Ferritin Panel  Vitamin D  deficiency Assessment & Plan: Continue supplement weekly  Also encouraged to spend 15 minutes in the sun daily.    Orders: -     VITAMIN D  25 Hydroxy (Vit-D Deficiency, Fractures)  Other long term (current) drug therapy -     CBC with Differential/Platelet  Thyroid  nodule -     TSH  Elevated LDL cholesterol level -     Lipid panel  Need for hepatitis B screening test -     Hepatitis B surface antibody,qualitative  Eczema, unspecified type Assessment & Plan: Recent severe flare-up on both arms and now on her leg improved but left scarring. Heat suspected as a trigger. - Prescribe Elocon  cream and ensure insurance coverage.  Orders: -     Mometasone  Furoate; Apply 1 Application topically daily.  Dispense: 45 g; Refill: 3  Obesity (BMI 30-39.9) Assessment & Plan: Regular physical activity and diet management in place. Encouraged increased activity for weight management and perimenopausal symptoms. - Encourage increased physical activity, aiming for 4-5 days a week or longer sessions. - Promote strength training to aid weight management and address perimenopausal symptoms.      Return for 1 year physical, 69m pre dm. Patient was given opportunity to ask questions. Patient verbalized understanding of the plan and was able to  repeat key elements of the plan. All questions were  answered to their satisfaction.   Gaines Ada, FNP  I, Gaines Ada, FNP, have reviewed all documentation for this visit. The documentation on 08/13/23 for the exam, diagnosis, procedures, and orders are all accurate and complete.

## 2023-08-14 LAB — CBC WITH DIFFERENTIAL/PLATELET
Basophils Absolute: 0 x10E3/uL (ref 0.0–0.2)
Basos: 1 %
EOS (ABSOLUTE): 0.2 x10E3/uL (ref 0.0–0.4)
Eos: 3 %
Hematocrit: 40.3 % (ref 34.0–46.6)
Hemoglobin: 12.3 g/dL (ref 11.1–15.9)
Immature Grans (Abs): 0 x10E3/uL (ref 0.0–0.1)
Immature Granulocytes: 0 %
Lymphocytes Absolute: 1.8 x10E3/uL (ref 0.7–3.1)
Lymphs: 28 %
MCH: 26.5 pg — ABNORMAL LOW (ref 26.6–33.0)
MCHC: 30.5 g/dL — ABNORMAL LOW (ref 31.5–35.7)
MCV: 87 fL (ref 79–97)
Monocytes Absolute: 0.4 x10E3/uL (ref 0.1–0.9)
Monocytes: 6 %
Neutrophils Absolute: 4.1 x10E3/uL (ref 1.4–7.0)
Neutrophils: 62 %
Platelets: 263 x10E3/uL (ref 150–450)
RBC: 4.65 x10E6/uL (ref 3.77–5.28)
RDW: 14.8 % (ref 11.7–15.4)
WBC: 6.5 x10E3/uL (ref 3.4–10.8)

## 2023-08-14 LAB — CMP14+EGFR
ALT: 21 IU/L (ref 0–32)
AST: 22 IU/L (ref 0–40)
Albumin: 4.4 g/dL (ref 3.9–4.9)
Alkaline Phosphatase: 61 IU/L (ref 44–121)
BUN/Creatinine Ratio: 15 (ref 9–23)
BUN: 13 mg/dL (ref 6–24)
Bilirubin Total: 0.4 mg/dL (ref 0.0–1.2)
CO2: 21 mmol/L (ref 20–29)
Calcium: 9.6 mg/dL (ref 8.7–10.2)
Chloride: 103 mmol/L (ref 96–106)
Creatinine, Ser: 0.87 mg/dL (ref 0.57–1.00)
Globulin, Total: 2.7 g/dL (ref 1.5–4.5)
Glucose: 82 mg/dL (ref 70–99)
Potassium: 4 mmol/L (ref 3.5–5.2)
Sodium: 134 mmol/L (ref 134–144)
Total Protein: 7.1 g/dL (ref 6.0–8.5)
eGFR: 82 mL/min/1.73 (ref >59)

## 2023-08-14 LAB — HEMOGLOBIN A1C
Est. average glucose Bld gHb Est-mCnc: 114 mg/dL
Hgb A1c MFr Bld: 5.6 % (ref 4.8–5.6)

## 2023-08-14 LAB — LIPID PANEL
Chol/HDL Ratio: 4.2 ratio (ref 0.0–4.4)
Cholesterol, Total: 219 mg/dL — ABNORMAL HIGH (ref 100–199)
HDL: 52 mg/dL (ref >39)
LDL Chol Calc (NIH): 152 mg/dL — ABNORMAL HIGH (ref 0–99)
Triglycerides: 86 mg/dL (ref 0–149)
VLDL Cholesterol Cal: 15 mg/dL (ref 5–40)

## 2023-08-14 LAB — IRON,TIBC AND FERRITIN PANEL
Ferritin: 15 ng/mL (ref 15–150)
Iron Saturation: 10 % — ABNORMAL LOW (ref 15–55)
Iron: 43 ug/dL (ref 27–159)
Total Iron Binding Capacity: 415 ug/dL (ref 250–450)
UIBC: 372 ug/dL (ref 131–425)

## 2023-08-14 LAB — HEPATITIS B SURFACE ANTIBODY,QUALITATIVE: Hep B Surface Ab, Qual: REACTIVE

## 2023-08-14 LAB — TSH: TSH: 1.82 u[IU]/mL (ref 0.450–4.500)

## 2023-08-14 LAB — VITAMIN D 25 HYDROXY (VIT D DEFICIENCY, FRACTURES): Vit D, 25-Hydroxy: 30.6 ng/mL (ref 30.0–100.0)

## 2023-08-26 ENCOUNTER — Ambulatory Visit: Payer: Self-pay | Admitting: Nurse Practitioner

## 2023-08-26 DIAGNOSIS — L309 Dermatitis, unspecified: Secondary | ICD-10-CM | POA: Insufficient documentation

## 2023-08-26 NOTE — Assessment & Plan Note (Signed)
 Regular physical activity and diet management in place. Encouraged increased activity for weight management and perimenopausal symptoms. - Encourage increased physical activity, aiming for 4-5 days a week or longer sessions. - Promote strength training to aid weight management and address perimenopausal symptoms.

## 2023-08-26 NOTE — Assessment & Plan Note (Signed)
 Managed with oral slow-release iron supplements. IV iron recommended only if levels drop significantly. - Continue oral slow-release iron supplementation.

## 2023-08-26 NOTE — Assessment & Plan Note (Signed)
 Up-to-date with Pap smear and mammogram. Hepatitis B vaccination status needs verification. Regular dental visits and self-breast exams. Slightly irregular menstrual cycle. - Verify hepatitis B vaccination status and perform titer if necessary. - Encourage continued regular screenings and self-exams. - Advise on the use of sunscreen during prolonged sun exposure.

## 2023-08-26 NOTE — Assessment & Plan Note (Signed)
 Continue supplement weekly  Also encouraged to spend 15 minutes in the sun daily.

## 2023-08-26 NOTE — Assessment & Plan Note (Signed)
 Recent severe flare-up on both arms and now on her leg improved but left scarring. Heat suspected as a trigger. - Prescribe Elocon  cream and ensure insurance coverage.

## 2023-08-26 NOTE — Assessment & Plan Note (Signed)
No current medications, encouraged to exercise regularly and eat a healthy diet low in sugar and starches.

## 2023-09-13 ENCOUNTER — Ambulatory Visit

## 2023-09-13 ENCOUNTER — Ambulatory Visit: Admitting: Podiatry

## 2023-09-13 VITALS — Ht 66.0 in | Wt 221.8 lb

## 2023-09-13 DIAGNOSIS — M722 Plantar fascial fibromatosis: Secondary | ICD-10-CM

## 2023-09-13 DIAGNOSIS — M7752 Other enthesopathy of left foot: Secondary | ICD-10-CM

## 2023-09-13 DIAGNOSIS — M62462 Contracture of muscle, left lower leg: Secondary | ICD-10-CM

## 2023-09-13 NOTE — Progress Notes (Signed)
  Subjective:  Patient ID: Sarah Shaw, female    DOB: Jan 17, 1976,  MRN: [id number]  Chief Complaint  Patient presents with   Foot Pain    RM 20 Patient is here for left heel pain. Pain has been present for the last month. Pain is described as a dull achy pain more intense int he morning. Patient has tried insoles, stretching and a support brace with minimum relief.    48 y.o. female presents with the above complaint. History confirmed with patient.  She returns today with a new issue today on the left foot her right foot is doing fine.  She has been wearing her ASICS and insoles for the left foot notes pain first in the morning she is out of bed that is significant and worse as she is on it as the day goes on  Objective:  Physical Exam: warm, good capillary refill, no trophic changes or ulcerative lesions, normal DP and PT pulses, normal sensory exam, and her left foot has pes planus deformity she has significant gastrocnemius equinus and pain on palpation of the insertion leneva band of the plantar fascia on the plantar medial heel.   Radiographs: Multiple views x-ray of the left foot: no fracture, dislocation, swelling or degenerative changes noted, plantar calcaneal spur, posterior calcaneal spur, and pes planus Assessment:   1. Plantar fasciitis of left foot   2. Gastrocnemius equinus of left lower extremity      Plan:  Patient was evaluated and treated and all questions answered.  Discussed the etiology and treatment options for plantar fasciitis including stretching, formal physical therapy, supportive shoegears such as a running shoe or sneaker, pre fabricated orthoses, injection therapy, and oral medications. We also discussed the role of surgical treatment of this for patients who do not improve after exhausting non-surgical treatment options.   -XR reviewed with patient -Educated patient on stretching and icing of the affected limb -Night splint dispensed today to  alleviate her gastrocnemius equinus contracture we discussed the impact of this on plantar fasciitis and this should alleviate this as well. -Plantar fascial brace dispensed facilitate soft tissue healing and offloading of plantar fascia -Rx for meloxicam  previously send she still has some on that she will continue to take and let me know if she needs refill. Educated on use, risks and benefits of the medication -I will see her back in 1 month if not improved by that point would recommend physical therapy formally and corticosteroid injection.   Return in about 1 month (around 10/14/2023) for recheck plantar fasciitis.

## 2023-09-13 NOTE — Patient Instructions (Signed)

## 2023-10-11 ENCOUNTER — Ambulatory Visit: Admitting: Podiatry

## 2023-10-23 ENCOUNTER — Ambulatory Visit (INDEPENDENT_AMBULATORY_CARE_PROVIDER_SITE_OTHER): Admitting: Podiatry

## 2023-10-23 VITALS — Ht 66.0 in | Wt 221.8 lb

## 2023-10-23 DIAGNOSIS — M722 Plantar fascial fibromatosis: Secondary | ICD-10-CM | POA: Diagnosis not present

## 2023-10-23 DIAGNOSIS — M62462 Contracture of muscle, left lower leg: Secondary | ICD-10-CM

## 2023-10-24 NOTE — Progress Notes (Signed)
  Subjective:  Patient ID: Sarah Shaw, female    DOB: October 14, 1975,  MRN: [id number]  Chief Complaint  Patient presents with   Plantar Fasciitis    RM 3 4 wk f/u on plantar fasciitis. Pt states a 50% improvement in pain since wearing a brace and night splint.    48 y.o. female presents with the above complaint. History confirmed with patient.  She returns today has made some improvement she is using the splint and brace and doing her stretches using the meloxicam  daily Objective:  Physical Exam: warm, good capillary refill, no trophic changes or ulcerative lesions, normal DP and PT pulses, normal sensory exam, and her left foot has pes planus deformity she has significant gastrocnemius equinus and pain on palpation of the insertion medial band of the plantar fascia on the plantar medial heel.   Radiographs: Multiple views x-ray of the left foot: no fracture, dislocation, swelling or degenerative changes noted, plantar calcaneal spur, posterior calcaneal spur, and pes planus Assessment:   1. Plantar fasciitis of left foot   2. Gastrocnemius equinus of left lower extremity       Plan:  Patient was evaluated and treated and all questions answered.  Pain is improved we discussed continuing her home physical therapy plan meloxicam  and bracing options.  We also discussed that a injection may accelerate her progress but I expect that she will likely be able to improve without this as well.  She will continue on this plan and return to see me in 8 weeks or sooner if it worsens.  Return in about 8 weeks (around 12/18/2023) for recheck plantar fasciitis.

## 2023-12-17 ENCOUNTER — Other Ambulatory Visit: Payer: Self-pay | Admitting: Nurse Practitioner

## 2023-12-17 ENCOUNTER — Other Ambulatory Visit (HOSPITAL_COMMUNITY): Payer: Self-pay

## 2023-12-17 ENCOUNTER — Other Ambulatory Visit: Payer: Self-pay

## 2023-12-17 DIAGNOSIS — E559 Vitamin D deficiency, unspecified: Secondary | ICD-10-CM

## 2023-12-17 MED ORDER — VITAMIN D (ERGOCALCIFEROL) 1.25 MG (50000 UNIT) PO CAPS
50000.0000 [IU] | ORAL_CAPSULE | ORAL | 1 refills | Status: DC
  Filled 2023-12-17: qty 12, 84d supply, fill #0

## 2023-12-18 ENCOUNTER — Ambulatory Visit: Admitting: Podiatry

## 2023-12-24 ENCOUNTER — Other Ambulatory Visit (HOSPITAL_COMMUNITY): Payer: Self-pay

## 2024-02-11 ENCOUNTER — Telehealth: Admitting: Nurse Practitioner

## 2024-02-11 ENCOUNTER — Other Ambulatory Visit (HOSPITAL_COMMUNITY): Payer: Self-pay

## 2024-02-11 DIAGNOSIS — R5383 Other fatigue: Secondary | ICD-10-CM | POA: Diagnosis not present

## 2024-02-11 DIAGNOSIS — E559 Vitamin D deficiency, unspecified: Secondary | ICD-10-CM

## 2024-02-11 DIAGNOSIS — D508 Other iron deficiency anemias: Secondary | ICD-10-CM

## 2024-02-11 DIAGNOSIS — Z79899 Other long term (current) drug therapy: Secondary | ICD-10-CM | POA: Diagnosis not present

## 2024-02-11 DIAGNOSIS — E7800 Pure hypercholesterolemia, unspecified: Secondary | ICD-10-CM | POA: Diagnosis not present

## 2024-02-11 DIAGNOSIS — R7303 Prediabetes: Secondary | ICD-10-CM

## 2024-02-11 MED ORDER — VITAMIN D (ERGOCALCIFEROL) 1.25 MG (50000 UNIT) PO CAPS
50000.0000 [IU] | ORAL_CAPSULE | ORAL | 1 refills | Status: AC
  Filled 2024-02-11 – 2024-03-09 (×3): qty 12, 84d supply, fill #0

## 2024-02-11 NOTE — Assessment & Plan Note (Signed)
 Normal vitamin d  levels at last visit.  Continue supplement weekly  Also encouraged to spend 15 minutes in the sun daily.

## 2024-02-11 NOTE — Progress Notes (Signed)
 "  Virtual Visit via Video Note  I,Jameka J Llittleton, CMA,acting as a scribe for Gaines Ada, FNP.,have documented all relevant documentation on the behalf of Gaines Ada, FNP,as directed by  Gaines Ada, FNP while in the presence of Gaines Ada, FNP.  I connected with Sarah Shaw on 02/11/2024 at  2:20 PM EST by a video enabled telemedicine application and verified that I am speaking with the correct person using two identifiers.  Patient Location: Home Provider Location: Home Office  I discussed the limitations, risks, security, and privacy concerns of performing an evaluation and management service by video and the availability of in person appointments. I also discussed with the patient that there may be a patient responsible charge related to this service. The patient expressed understanding and agreed to proceed.  Subjective: PCP: Ada Gaines, FNP  Chief Complaint  Patient presents with   Prediabetes    Patient presents today for a prediabetes check. Patient reports compliance with her meds. Patient doesn't have any questions or concerns at this time   She is exercising about 3 days a week. Eating habits are good with fruits and vegetables. She has been having more fatigue over the last few weeks, continues to take her iron supplement 3 days a week. She continues with vitamin d  supplement as well. She has had her flu vaccine in October will provide a copy.      ROS: Per HPI Current Medications[1]  Observations/Objective: Today's Vitals   Physical Exam Vitals and nursing note reviewed.  Constitutional:      General: She is not in acute distress.    Appearance: Normal appearance. She is obese.  Pulmonary:     Effort: Pulmonary effort is normal. No respiratory distress.  Skin:    Capillary Refill: Capillary refill takes less than 2 seconds.  Neurological:     General: No focal deficit present.     Mental Status: She is alert and oriented to person, place, and  time.     Cranial Nerves: No cranial nerve deficit.  Psychiatric:        Mood and Affect: Mood normal.        Behavior: Behavior normal.        Thought Content: Thought content normal.        Judgment: Judgment normal.     Assessment and Plan: Prediabetes Assessment & Plan: No current medications, encouraged to exercise regularly and eat a healthy diet low in sugar and starches. A1c has been normal for quite some time if remains normal will not repeat again for at least one year.   Orders: -     CMP14+EGFR; Future -     Hemoglobin A1c; Future  Iron deficiency anemia secondary to inadequate dietary iron intake Assessment & Plan: Will recheck levels, taking iron 3 days a week.   Orders: -     CBC; Future  Elevated LDL cholesterol level -     Lipid panel; Future  Vitamin D  deficiency Assessment & Plan: Normal vitamin d  levels at last visit.  Continue supplement weekly  Also encouraged to spend 15 minutes in the sun daily.    Orders: -     Vitamin D  (Ergocalciferol ); Take 1 capsule (50,000 Units total) by mouth every 7 (seven) days.  Dispense: 12 capsule; Refill: 1  Other fatigue Assessment & Plan: Will check for metabolic causes.   Orders: -     Iron, TIBC and Ferritin Panel; Future -     Vitamin B12 -  TSH  Other long term (current) drug therapy    Follow Up Instructions: Return in about 3 months (around 05/11/2024) for predm check.   I discussed the assessment and treatment plan with the patient. The patient was provided an opportunity to ask questions, and all were answered. The patient agreed with the plan and demonstrated an understanding of the instructions.   The patient was advised to call back or seek an in-person evaluation if the symptoms worsen or if the condition fails to improve as anticipated.  The above assessment and management plan was discussed with the patient. The patient verbalized understanding of and has agreed to the management plan.    LILLETTE Gaines Ada, FNP, have reviewed all documentation for this visit. The documentation on 02/11/2024 for the exam, diagnosis, procedures, and orders are all accurate and complete.      [1]  Current Outpatient Medications:    cyanocobalamin  100 MCG tablet, Take 100 mcg by mouth daily., Disp: , Rfl:    ferrous sulfate  325 (65 FE) MG tablet, Take 325 mg by mouth daily with breakfast., Disp: , Rfl:    mometasone  (ELOCON ) 0.1 % cream, Apply 1 Application topically daily., Disp: 45 g, Rfl: 3   Vitamin D , Ergocalciferol , (DRISDOL ) 1.25 MG (50000 UNIT) CAPS capsule, Take 1 capsule (50,000 Units total) by mouth every 7 (seven) days., Disp: 12 capsule, Rfl: 1  "

## 2024-02-11 NOTE — Assessment & Plan Note (Signed)
 No current medications, encouraged to exercise regularly and eat a healthy diet low in sugar and starches. A1c has been normal for quite some time if remains normal will not repeat again for at least one year.

## 2024-02-11 NOTE — Assessment & Plan Note (Signed)
Will check for metabolic causes.

## 2024-02-11 NOTE — Patient Instructions (Signed)
 Prediabetes: What to Know Prediabetes is when your blood sugar, also called glucose, is at a higher level than normal but not high enough for you to be diagnosed with type 2 diabetes (type 2 diabetes mellitus). Having prediabetes puts you at risk for getting type 2 diabetes. By making some healthy changes, you may be able to prevent or delay getting type 2 diabetes. This is important because type 2 diabetes can lead to serious problems. Some of these include: Heart disease. Stroke. Blindness. Kidney disease. Depression. Poor blood flow in the feet and legs. In very bad cases, this could lead to having a leg removed by surgery (amputation). What are the causes? The exact cause of prediabetes isn't known. It may result from insulin resistance. Insulin resistance happens when cells in the body don't respond properly to insulin that the body makes. This can cause too much sugar to build up in the blood. High blood sugar, also called hyperglycemia, can develop. What increases the risk? Having a family member with type 2 diabetes. Being older than 50 years of age. Having had a temporary form of diabetes during a pregnancy. This is called gestational diabetes. Having had polycystic ovary syndrome (PCOS). Being overweight or obese. Being inactive and not getting much exercise. Having a history of heart disease. This may include problems with cholesterol levels, high levels of blood fats, or high blood pressure. What are the signs or symptoms? You may have no symptoms. If you do have symptoms, they may include: Increased hunger. Increased thirst. Needing to pee more often. Changes in how you see, like blurry vision. Feeling tired. How is this diagnosed? Prediabetes can be diagnosed with blood tests that check your blood sugar. One or more of these tests may be done: A fasting blood glucose (FBG) test. You won't be allowed to eat (you will fast) for at least 8 hours before a blood sample is  taken. An A1C blood test, also called a hemoglobin A1C test. This test shows information about blood sugar levels over the past 2?3 months. An oral glucose tolerance test (OGTT). This test measures your blood sugar at two points in time: After you haven't eaten for a while. This is your baseline level. Two hours after you drink a beverage that has sugar in it. You may be diagnosed with prediabetes if: Your FBG is 100?125 mg/dL (1.6-1.0 mmol/L). Your A1C level is 5.7?6.4% (39-46 mmol/mol). Your OGTT result is 140?199 mg/dL (9.6-04 mmol/L). These blood tests may need to be done again to be sure of the diagnosis. How is this treated? Treatment may include making changes to your diet and lifestyle. These changes can help lower your blood sugar and keep you from getting type 2 diabetes. In some cases, medicine may be given to help lower your risk. Follow these instructions at home: Eating and drinking  Eat and drink as told. Follow a healthy meal plan. This includes eating lean proteins, whole grains, legumes, fresh fruits and vegetables, low-fat dairy products, and healthy fats. Meet with an expert in healthy eating called a dietitian. This person can help create a healthy eating plan that's right for you. Lifestyle Do moderate-intensity exercise. Do this for at least 30 minutes a day on 5 or more days each week, or as told by your health care provider. A mix of activities may be best. Good choices include brisk walking, swimming, biking, and weight lifting. Try to lose weight if your provider says it's OK. Losing 5-7% of your body weight can  help reverse insulin resistance. Do not drink alcohol if: Your provider tells you not to drink. You're pregnant, may be pregnant, or plan to become pregnant. If you drink alcohol: Limit how much you have to: 0-1 drink a day if you're female. 0-2 drinks a day if you're female. Know how much alcohol is in your drink. In the [place] one drink is one 12 oz  bottle of beer (355 mL), one 5 oz glass of wine (148 mL), or one 1 oz glass of hard liquor (44 mL). General instructions Take medicines only as told. You may be given medicines that help lower the risk of type 2 diabetes. Do not smoke, vape, or use nicotine or tobacco. Where to find more information American Diabetes Association: diabetes.org/about-diabetes/prediabetes Academy of Nutrition and Dietetics: eatright.org American Heart Association: Go to ThisJobs.cz. Click the search icon. Type "prediabetes" in the search box. Contact a health care provider if: You have any of these symptoms: Increased hunger. Peeing more often than usual. Increased thirst. Feeling tired. Changes in how you see, like blurry vision. Feeling like you may throw up. Throwing up. Get help right away if: You have shortness of breath. You feel confused. This information is not intended to replace advice given to you by your health care provider. Make sure you discuss any questions you have with your health care provider. Document Revised: 08/27/2022 Document Reviewed: 08/27/2022 Elsevier Patient Education  2024 [employer].

## 2024-02-11 NOTE — Assessment & Plan Note (Signed)
 Will recheck levels, taking iron 3 days a week.

## 2024-02-12 ENCOUNTER — Other Ambulatory Visit

## 2024-02-12 DIAGNOSIS — R7303 Prediabetes: Secondary | ICD-10-CM

## 2024-02-12 DIAGNOSIS — R5383 Other fatigue: Secondary | ICD-10-CM

## 2024-02-12 DIAGNOSIS — D508 Other iron deficiency anemias: Secondary | ICD-10-CM

## 2024-02-12 DIAGNOSIS — E7800 Pure hypercholesterolemia, unspecified: Secondary | ICD-10-CM

## 2024-02-13 ENCOUNTER — Ambulatory Visit: Payer: Self-pay | Admitting: Nurse Practitioner

## 2024-02-13 LAB — LIPID PANEL
Chol/HDL Ratio: 3.7 ratio (ref 0.0–4.4)
Cholesterol, Total: 198 mg/dL (ref 100–199)
HDL: 53 mg/dL
LDL Chol Calc (NIH): 130 mg/dL — ABNORMAL HIGH (ref 0–99)
Triglycerides: 83 mg/dL (ref 0–149)
VLDL Cholesterol Cal: 15 mg/dL (ref 5–40)

## 2024-02-13 LAB — IRON,TIBC AND FERRITIN PANEL
Ferritin: 25 ng/mL (ref 15–150)
Iron Saturation: 7 % — CL (ref 15–55)
Iron: 32 ug/dL (ref 27–159)
Total Iron Binding Capacity: 442 ug/dL (ref 250–450)
UIBC: 410 ug/dL (ref 131–425)

## 2024-02-13 LAB — CMP14+EGFR
ALT: 20 IU/L (ref 0–32)
AST: 19 IU/L (ref 0–40)
Albumin: 4.4 g/dL (ref 3.9–4.9)
Alkaline Phosphatase: 58 IU/L (ref 41–116)
BUN/Creatinine Ratio: 16 (ref 9–23)
BUN: 12 mg/dL (ref 6–24)
Bilirubin Total: 0.4 mg/dL (ref 0.0–1.2)
CO2: 19 mmol/L — ABNORMAL LOW (ref 20–29)
Calcium: 9.4 mg/dL (ref 8.7–10.2)
Chloride: 101 mmol/L (ref 96–106)
Creatinine, Ser: 0.73 mg/dL (ref 0.57–1.00)
Globulin, Total: 2.5 g/dL (ref 1.5–4.5)
Glucose: 81 mg/dL (ref 70–99)
Potassium: 4.3 mmol/L (ref 3.5–5.2)
Sodium: 136 mmol/L (ref 134–144)
Total Protein: 6.9 g/dL (ref 6.0–8.5)
eGFR: 101 mL/min/1.73

## 2024-02-13 LAB — CBC
Hematocrit: 34.7 % (ref 34.0–46.6)
Hemoglobin: 10.8 g/dL — ABNORMAL LOW (ref 11.1–15.9)
MCH: 24.7 pg — ABNORMAL LOW (ref 26.6–33.0)
MCHC: 31.1 g/dL — ABNORMAL LOW (ref 31.5–35.7)
MCV: 79 fL (ref 79–97)
Platelets: 292 x10E3/uL (ref 150–450)
RBC: 4.38 x10E6/uL (ref 3.77–5.28)
RDW: 14.7 % (ref 11.7–15.4)
WBC: 8.1 x10E3/uL (ref 3.4–10.8)

## 2024-02-13 LAB — HEMOGLOBIN A1C
Est. average glucose Bld gHb Est-mCnc: 123 mg/dL
Hgb A1c MFr Bld: 5.9 % — ABNORMAL HIGH (ref 4.8–5.6)

## 2024-03-09 ENCOUNTER — Other Ambulatory Visit: Payer: Self-pay

## 2024-03-09 ENCOUNTER — Other Ambulatory Visit (HOSPITAL_COMMUNITY): Payer: Self-pay

## 2024-03-10 ENCOUNTER — Other Ambulatory Visit: Payer: Self-pay

## 2024-03-13 ENCOUNTER — Other Ambulatory Visit: Payer: Self-pay

## 2024-08-13 ENCOUNTER — Encounter: Payer: Self-pay | Admitting: Nurse Practitioner
